# Patient Record
Sex: Male | Born: 1995 | Race: White | Hispanic: No | Marital: Single | Smoking: Never smoker
Health system: Southern US, Community
[De-identification: ages and names within clinical notes are randomized; demographics above are authoritative.]

## PROBLEM LIST (undated history)

## (undated) DIAGNOSIS — G40A19 Absence epileptic syndrome, intractable, without status epilepticus: Secondary | ICD-10-CM

## (undated) DIAGNOSIS — R011 Cardiac murmur, unspecified: Secondary | ICD-10-CM

## (undated) DIAGNOSIS — R112 Nausea with vomiting, unspecified: Secondary | ICD-10-CM

## (undated) DIAGNOSIS — F840 Autistic disorder: Secondary | ICD-10-CM

## (undated) DIAGNOSIS — R569 Unspecified convulsions: Secondary | ICD-10-CM

## (undated) DIAGNOSIS — Z9889 Other specified postprocedural states: Secondary | ICD-10-CM

## (undated) DIAGNOSIS — R4689 Other symptoms and signs involving appearance and behavior: Secondary | ICD-10-CM

## (undated) DIAGNOSIS — G9349 Other encephalopathy: Secondary | ICD-10-CM

## (undated) HISTORY — DX: Other symptoms and signs involving appearance and behavior: R46.89

## (undated) HISTORY — DX: Unspecified convulsions: R56.9

## (undated) HISTORY — DX: Other encephalopathy: G93.49

## (undated) HISTORY — PX: DENTAL SURGERY: SHX609

---
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
## 2016-01-04 ENCOUNTER — Telehealth: Payer: Self-pay

## 2016-01-04 NOTE — Telephone Encounter (Signed)
Con MemosAlex Young called stating that the patient's seizures have decreased but his behavior has definitely increased. He wanted to see about adjusting the patient's medication. He is requesting a call back.  CB:[phone]

## 2016-01-04 NOTE — Telephone Encounter (Addendum)
Seizures are in much better control.  I'm going to leave his dose as it is.  I think that he is having periods of agitation.  We will try 100 mg of vitamin B 6.  His caregiver does not want to give up seizure control for behavior.  He has been combative and engage in behaviors that were not present before starting levetiracetam.

## 2016-01-18 ENCOUNTER — Ambulatory Visit (INDEPENDENT_AMBULATORY_CARE_PROVIDER_SITE_OTHER): Payer: Medicaid Other | Admitting: Pediatrics

## 2016-01-18 ENCOUNTER — Encounter: Payer: Self-pay | Admitting: Pediatrics

## 2016-01-18 VITALS — Ht 73.0 in | Wt 187.0 lb

## 2016-01-18 DIAGNOSIS — F840 Autistic disorder: Secondary | ICD-10-CM

## 2016-01-18 DIAGNOSIS — G40209 Localization-related (focal) (partial) symptomatic epilepsy and epileptic syndromes with complex partial seizures, not intractable, without status epilepticus: Secondary | ICD-10-CM

## 2016-01-18 DIAGNOSIS — F71 Moderate intellectual disabilities: Secondary | ICD-10-CM | POA: Diagnosis not present

## 2016-01-18 MED ORDER — LEVETIRACETAM 500 MG PO TABS: ORAL_TABLET | ORAL | Status: DC

## 2016-01-18 NOTE — Progress Notes (Signed)
Patient: Nathan Moore MRN: [id number] Sex: male DOB: August 17, 1996  Provider: Deetta Perla, MD Location of Care: [facility]  Note type: Routine return visit  History of Present Illness: Referral Source: Porfirio Mylar Dohmeier History from: Home Caregiver, patient and [facility] chart Chief Complaint:Seizure  Nathan Moore is a 20 y.o. male who returns January 18, 2016, for the first time since October 20, 2015.  He has a history of seizures initially treated with an extended release carbamazepine and now with levetiracetam and Onfi.  Seizures are complex partial in nature, and are of variable duration.  The frequency has dropped considerably.  He has experienced eight in the last two and half months.  At the time I initially saw him, he was experiencing three to four seizures per week.  When he has seizures, he tends to rub his fingers for prolonged periods of time and stare unresponsively.  He has not had any recent convulsive events.  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  He is able to follow some simple commands, but he did not speak.  He wanted to lie down on the table and I had to continually pull him up.  He was not particularly cooperative.  The staff taking care of him raised concerns about behavior problems on levetiracetam started, but those have dissipated.  I have recommended vitamin B6, but they are not using it.  He is followed by a nurse practitioner who works with a psychiatrist, Alejandro Mulling.  I do not know, which group she works for.  His health has been good.  He is sleeping well.    Review of Systems: 12 system review was assessed except as noted above was otherwise negative  Past Medical History Diagnosis Date  . Absence seizures, intractable (HCC)   . Autism disorder   . Seizures (HCC)   . Aggressive behavior   . Chronic static encephalopathy    Hospitalizations: No., Head Injury: No., Nervous System  Infections: No., Immunizations up to date: Yes.    See office note from [facility] August 31, 2015 for recent history related to seizures; recent medications used to attempt to control seizures included carbamazepine, clonazepam, and Onfi; Some episodes appeared clearly to be complex partial in nature and prolonged; others are less clearcut behaviors where he seems to be unresponsive and then suddenly returns to his baseline  Birth History 8 lbs. 10 oz. infant born at [redacted] weeks gestational age to a 19 year old male. Gestation was complicated by gestational diabetes Mother received Epidural anesthesia  primary cesarean section Nursery Course was complicated by hypoglycemia, requirement for oxygen supplementation, elevated bilirubin Growth and Development was recalled as global delays, problems with behavior started at age 57, seizures at age 63-1/2  Behavior History poor social interaction and autism spectrum disorder; self-mutilatory  Surgical History History reviewed. No pertinent past surgical history.  Family History family history is not on file. Family history is negative for migraines, seizures, intellectual disabilities, blindness, deafness, birth defects, chromosomal disorder, or autism.  Social History . Marital Status: Single    Spouse Name: N/A  . Number of Children: N/A  . Years of Education: N/A   Social History Main Topics  . Smoking status: Never Smoker   . Smokeless tobacco: None  . Alcohol Use: No  . Drug Use: No  . Sexual Activity: No   Social History Narrative    Nathan Moore is non-verbal. Nathan Moore is a 10th grade student at [employer]; he does well in school. [facility] and has been there for 6-7 months. His parents live in [place] and they visit once a month. His caregiver reports him to be complaint at 8/10 of his medical visits and he states that his non-compliance does not include aggression, he will just sit in  the same spot and not move.    No Known Allergies  Physical Exam BP   Pulse   Ht 6\' 1"  (1.854 m)  Wt 187 lb (84.823 kg)  BMI 24.68 kg/m2  General: alert, well developed, well nourished, in no acute distress, brown hair, right handed Head: normocephalic, no dysmorphic features Respiratory: auscultation clear Cardiovascular: no murmurs, pulses are normal Musculoskeletal: no skeletal deformities or apparent scoliosis Skin: acneform facial rash, no neurocutaneous lesions  Neurologic Exam  Mental Status: alert; wary, I had the keep getting him to sit up; nonverbal; able to follow some simple commands Cranial Nerves: visual fields are full to double simultaneous stimuli; extraocular movements are full and conjugate; pupils are round reactive to light; funduscopic examination shows positive red reflex bilaterally; symmetric facial strength; midline tongue; turns to localize sound bilaterally Motor: normal functional strength, tone and mass; unable to test pronator drift Sensory: withdrawal 4 Coordination: unable to test; in general no signs of tremor Gait and Station: slightly broad-based and shuffling gait and station; balance is adequate; Romberg exam is negative Reflexes: symmetric and diminished bilaterally; no clonus; bilateral flexor plantar responses  Assessment 1. Partial epilepsy with impairment of consciousness, G40.209. 2. Autism spectrum disorder with accompanying language impairment requiring very substantial support (level 3), F84.0. 3. 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  Discussion I am pleased that Nathan SalinaBenjamin is responding to levetiracetam.  My intent is to increase levetiracetam to 1000 mg twice a day.  No change will be made in Onfi.  I do not prescribe any of his other medications.  Plan He will return to see me in three months' time.  I asked his caregiver to contact me concerning his response to medication both in terms of side effects and beneficial effects.  I  spent 30 minutes of face-to-face time with Nathan SalinaBenjamin and his caregiver, more than half of it in consultation.   Medication List   This list is accurate as of: 01/18/16 11:59 PM.       ABILIFY 10 MG tablet  Generic drug:  ARIPiprazole  Take 10 mg by mouth 2 (two) times daily.     cetirizine 10 MG tablet  Commonly known as:  ZYRTEC  Take 10 mg by mouth daily.     chlorproMAZINE 100 MG tablet  Commonly known as:  THORAZINE  Take 100 mg by mouth. Take 2 tablets in the morning and 1 tablet in the evening     chlorproMAZINE 50 MG tablet  Commonly known as:  THORAZINE  TAKE 1 TABLET BY MOUTH EVERY 4 HOURS AS NEEDED FOR AGITATION     clindamycin-benzoyl peroxide gel  Commonly known as:  BENZACLIN  Apply topically 2 (two) times daily. Apply to skin     levETIRAcetam 500 MG tablet  Commonly known as:  KEPPRA  Take 2 tablets twice daily     ONFI 10 MG tablet  Generic drug:  cloBAZam  Take 1-1/2 tablets twice daily     polyethylene glycol packet  Commonly known as:  MIRALAX / GLYCOLAX  Take 17 g by mouth at bedtime.     propranolol 80 MG tablet  Commonly known  as:  INDERAL  Take 80 mg by mouth 3 (three) times daily.      The medication list was reviewed and reconciled. All changes or newly prescribed medications were explained.  A complete medication list was provided to the patient/caregiver.  Deetta Perla MD

## 2016-02-02 DIAGNOSIS — Z0279 Encounter for issue of other medical certificate: Secondary | ICD-10-CM

## 2016-03-22 ENCOUNTER — Other Ambulatory Visit: Payer: Self-pay | Admitting: Pediatrics

## 2016-03-23 ENCOUNTER — Emergency Department (HOSPITAL_COMMUNITY)
Admission: EM | Admit: 2016-03-23 | Discharge: 2016-03-23 | Disposition: A | Discharge status: Home / Self Care | Payer: Medicaid Other | Attending: Emergency Medicine | Admitting: Emergency Medicine

## 2016-03-23 ENCOUNTER — Encounter (HOSPITAL_COMMUNITY): Payer: Self-pay | Admitting: *Deleted

## 2016-03-23 DIAGNOSIS — L551 Sunburn of second degree: Secondary | ICD-10-CM | POA: Insufficient documentation

## 2016-03-23 DIAGNOSIS — R220 Localized swelling, mass and lump, head: Secondary | ICD-10-CM | POA: Diagnosis present

## 2016-03-23 MED ORDER — IBUPROFEN 600 MG PO TABS: 600.0000 mg | ORAL_TABLET | Freq: Four times a day (QID) | ORAL | Status: DC | PRN

## 2016-03-23 MED ORDER — CALAMINE EX LOTN: 1.0000 "application " | TOPICAL_LOTION | CUTANEOUS | Status: DC | PRN

## 2016-03-23 MED ORDER — IBUPROFEN 400 MG PO TABS
600.0000 mg | ORAL_TABLET | Freq: Once | ORAL | Status: AC
  Administered 2016-03-23: 600 mg via ORAL
  Filled 2016-03-23: qty 1

## 2016-03-23 NOTE — Discharge Instructions (Signed)
Sunburn °Sunburn is damage to the skin caused by overexposure to ultraviolet (UV) rays. People with light skin or a fair complexion may be more susceptible to sunburn. Repeated sun exposure causes early skin aging such as wrinkles and sun spots. It also increases the risk of skin cancer. °CAUSES °A sunburn is caused by getting too much UV radiation from the sun. °SYMPTOMS °· Red or pink skin. °· Soreness and swelling. °· Pain. °· Blisters. °· Peeling skin. °· Headache, fever, and fatigue if sunburn covers a large area. °TREATMENT °· Your caregiver may tell you to take certain medicines to lessen inflammation. °· Your caregiver may have you use hydrocortisone cream or spray to help with itching and inflammation. °· Your caregiver may prescribe an antibiotic cream to use on blisters. °HOME CARE INSTRUCTIONS  °· Avoid further exposure to the sun. °· Cool baths and cool compresses may be helpful if used several times per day. Do not apply ice, since this may result in more damage to the skin. °· Only take over-the-counter or prescription medicines for pain, discomfort, or fever as directed by your caregiver. °· Use aloe or other over-the-counter sunburn creams or gels on your skin. Do not apply these creams or gels on blisters. °· Drink enough fluids to keep your urine clear or pale yellow. °· Do not break blisters. If blisters break, your caregiver may recommend an antibiotic cream to apply to the affected area. °PREVENTION  °· Try to avoid the sun between 10:00 a.m. and 4:00 p.m. when it is the strongest. °· Apply sunscreen at least 30 minutes before exposure to the sun. °· Always wear protective hats, clothing, and sunglasses with UV protection. °· Avoid medicines, herbs, and foods that increase your sensitivity to sunlight. °· Avoid tanning beds. °SEEK IMMEDIATE MEDICAL CARE IF:  °· You have a fever. °· Your pain is uncontrolled with medicine. °· You start to vomit or have diarrhea. °· You feel faint or develop a  headache with confusion. °· You develop severe blistering. °· You have a pus-like (purulent) discharge coming from the blisters.  °· Your burn becomes more painful and swollen. °MAKE SURE YOU: °· Understand these instructions. °· Will watch your condition. °· Will get help right away if you are not doing well or get worse. °  °This information is not intended to replace advice given to you by your health care provider. Make sure you discuss any questions you have with your health care provider. °  °Document Released: 07/11/2005 Document Revised: 01/26/2013 Document Reviewed: 04/04/2015 °Elsevier Interactive Patient Education ©2016 Elsevier Inc. ° °

## 2016-03-23 NOTE — ED Notes (Signed)
Con MemosAlex Moore, Caregiver,  states he understands instructions.Pt home stable with caregiver.

## 2016-03-23 NOTE — ED Provider Notes (Signed)
CSN: [id number]     Arrival date & time 03/23/16  4098 History   First MD Initiated Contact with Patient 03/23/16 [phone]     Chief Complaint  Patient presents with  . Seizures  . Facial Swelling     (Consider location/radiation/quality/duration/timing/severity/associated sxs/prior Treatment) HPI   20 year old male with history of MR brought in for evaluation of sunburn. Patient apparently has extremely sensitive skin. He also has a history of seizures. His caretaker felt that he may have had a seizure and laid specialists on for some time. He has some facial swelling, erythema small amount of blistering. He is nonverbal at baseline. His caretaker states that sometimes will cry out when he has pain, but he has not done this since his sunburn.  Past Medical History  Diagnosis Date  . Absence seizures, intractable (HCC)   . Autism disorder   . Seizures (HCC)   . Aggressive behavior   . Chronic static encephalopathy    History reviewed. No pertinent past surgical history. No family history on file. Social History  Substance Use Topics  . Smoking status: Never Smoker   . Smokeless tobacco: None  . Alcohol Use: No    Review of Systems  All systems reviewed and negative, other than as noted in HPI.   Allergies  Review of patient's allergies indicates no known allergies.  Home Medications   Prior to Admission medications   Medication Sig Start Date End Date Taking? Authorizing Provider  ABILIFY 10 MG tablet Take 10 mg by mouth 2 (two) times daily. 07/06/15   Historical Provider, MD  calamine lotion Apply 1 application topically as needed for itching. 03/23/16   Raeford Razor, MD  cetirizine (ZYRTEC) 10 MG tablet Take 10 mg by mouth daily. 07/06/15   Historical Provider, MD  chlorproMAZINE (THORAZINE) 100 MG tablet Take 100 mg by mouth. Take 2 tablets in the morning and 1 tablet in the evening    Historical Provider, MD  chlorproMAZINE (THORAZINE) 50 MG tablet TAKE 1 TABLET BY MOUTH  EVERY 4 HOURS AS NEEDED FOR AGITATION 10/03/15   Historical Provider, MD  clindamycin-benzoyl peroxide (BENZACLIN) gel Apply topically 2 (two) times daily. Apply to skin    Historical Provider, MD  ibuprofen (ADVIL,MOTRIN) 600 MG tablet Take 1 tablet (600 mg total) by mouth every 6 (six) hours as needed. 03/23/16   Raeford Razor, MD  levETIRAcetam (KEPPRA) 500 MG tablet Take 2 tablets twice daily 01/18/16   Deetta Perla, MD  ONFI 10 MG tablet TAKE ONE AND A HALF TABLETS BY MOUTH 2 TIMES DAILY 03/22/16   Elveria Rising, NP  polyethylene glycol (MIRALAX / GLYCOLAX) packet Take 17 g by mouth at bedtime.    Historical Provider, MD  propranolol (INDERAL) 80 MG tablet Take 80 mg by mouth 3 (three) times daily. 07/06/15   Historical Provider, MD   BP 115/89 mmHg  Pulse 94  Resp 16  SpO2 98% Physical Exam  Constitutional: He appears well-developed and well-nourished. No distress.  HENT:  Head: Normocephalic and atraumatic.  Diffuse erythema of the face and also to left hand and forearm. Bilateral periorbital swelling, left worse than right. Can't spontaneously open his eyes. No conjunctival injection. He does not appear to have photophobia with bright light shined into his eyes. There is a small amount of blistering.  Eyes: Conjunctivae are normal. Right eye exhibits no discharge. Left eye exhibits no discharge.  Neck: Neck supple.  Cardiovascular: Normal rate, regular rhythm and normal heart sounds.  Exam  reveals no gallop and no friction rub.   No murmur heard. Pulmonary/Chest: Effort normal and breath sounds normal. No respiratory distress.  Abdominal: Soft. He exhibits no distension. There is no tenderness.  Musculoskeletal: He exhibits no edema or tenderness.  Neurological: He is alert.  Skin: Skin is warm and dry.  Nursing note and vitals reviewed.   ED Course  Procedures (including critical care time) Labs Review Labs Reviewed - No data to display  Imaging Review No results  found. I have personally reviewed and evaluated these images and lab results as part of my medical decision-making.   EKG Interpretation None      MDM   Final diagnoses:  Sunburn, second degree    20 year old male with second-degree sunburn. Symptomatic treatment. Wound care as needed.    Raeford RazorStephen Jireh Elmore, MD 04/05/16 1352

## 2016-03-23 NOTE — ED Notes (Signed)
Per EMS- pt had a seizure yesterday and was laying outside. Pt has sun sensitivity and is noted to have burn to left side of face and arm. Pt woke this morning with swelling to left side of face as well. Pt ambulatory but nonverbal at baseline

## 2016-04-24 ENCOUNTER — Encounter (HOSPITAL_COMMUNITY): Payer: Self-pay

## 2016-04-24 ENCOUNTER — Emergency Department (HOSPITAL_COMMUNITY)
Admission: EM | Admit: 2016-04-24 | Discharge: 2016-04-24 | Disposition: A | Discharge status: Home / Self Care | Payer: Medicaid Other | Attending: Emergency Medicine | Admitting: Emergency Medicine

## 2016-04-24 DIAGNOSIS — F79 Unspecified intellectual disabilities: Secondary | ICD-10-CM | POA: Insufficient documentation

## 2016-04-24 DIAGNOSIS — R569 Unspecified convulsions: Secondary | ICD-10-CM

## 2016-04-24 DIAGNOSIS — G40909 Epilepsy, unspecified, not intractable, without status epilepticus: Secondary | ICD-10-CM | POA: Diagnosis not present

## 2016-04-24 DIAGNOSIS — Z79899 Other long term (current) drug therapy: Secondary | ICD-10-CM | POA: Diagnosis not present

## 2016-04-24 LAB — BASIC METABOLIC PANEL
Anion gap: 6 (ref 5–15)
BUN: 12 mg/dL (ref 6–20)
CALCIUM: 9.1 mg/dL (ref 8.9–10.3)
CHLORIDE: 109 mmol/L (ref 101–111)
CO2: 26 mmol/L (ref 22–32)
CREATININE: 0.82 mg/dL (ref 0.61–1.24)
GFR calc Af Amer: >60 mL/min (ref >60)
GFR calc non Af Amer: >60 mL/min (ref >60)
GLUCOSE: 108 mg/dL — AB (ref 65–99)
Potassium: 3.9 mmol/L (ref 3.5–5.1)
Sodium: 141 mmol/L (ref 135–145)

## 2016-04-24 LAB — CBC
HCT: 45.4 % (ref 39.0–52.0)
Hemoglobin: 15.8 g/dL (ref 13.0–17.0)
MCH: 29.3 pg (ref 26.0–34.0)
MCHC: 34.8 g/dL (ref 30.0–36.0)
MCV: 84.1 fL (ref 78.0–100.0)
PLATELETS: 127 10*3/uL — AB (ref 150–400)
RBC: 5.4 MIL/uL (ref 4.22–5.81)
RDW: 12.5 % (ref 11.5–15.5)
WBC: 8.9 10*3/uL (ref 4.0–10.5)

## 2016-04-24 LAB — URINALYSIS, ROUTINE W REFLEX MICROSCOPIC
BILIRUBIN URINE: NEGATIVE
GLUCOSE, UA: NEGATIVE mg/dL
HGB URINE DIPSTICK: NEGATIVE
KETONES UR: NEGATIVE mg/dL
Leukocytes, UA: NEGATIVE
Nitrite: NEGATIVE
PROTEIN: NEGATIVE mg/dL
Specific Gravity, Urine: 1.021 (ref 1.005–1.030)
pH: 6.5 (ref 5.0–8.0)

## 2016-04-24 LAB — CBG MONITORING, ED: Glucose-Capillary: 98 mg/dL (ref 65–99)

## 2016-04-24 MED ORDER — SODIUM CHLORIDE 0.9 % IV SOLN
1000.0000 mg | Freq: Once | INTRAVENOUS | Status: AC
  Administered 2016-04-24: 1000 mg via INTRAVENOUS
  Filled 2016-04-24: qty 10

## 2016-04-24 NOTE — ED Notes (Signed)
Bed: GN56WA05 Expected date:  Expected time:  Means of arrival:  Comments: EMS- seizure, hyperglycemia

## 2016-04-24 NOTE — ED Notes (Addendum)
PT RECEIVED VIA EMS FOR A WITNESSED SEIZURE WHILE EATING AT [employer] WITH HIS CAREGIVER. PER THE CAREGIVER, THE SEIZURE LASTED APPROX 3 MINUTES AND HE WAS POSTICTAL UPON THEIR ARRIVAL. INITIAL CBG WAS "HIGH" PER EMS. PT HAS A HISTORY OF AUTISM, EPILEPSY, AND IS NONVERBAL, NO HX OF DM. NO HEAD INJURY.

## 2016-04-24 NOTE — ED Notes (Signed)
Pt ambulatory and independent at discharge.  Left with group home staff.

## 2016-04-24 NOTE — ED Notes (Addendum)
[name] YOUNG SUPERVISOR FROM THE GROUP HOME AT THE BEDSIDE.

## 2016-04-24 NOTE — Discharge Instructions (Signed)
Continue home keppra. Recommend to follow-up with your primary care doctor May also wish to follow-up with your neurologist. Return here for new concerns.

## 2016-04-24 NOTE — ED Notes (Signed)
PT HAD A WITNESSED FOCAL SEIZURE. SEIZURE PADS IN PLACE FOR SAFETY.

## 2016-04-24 NOTE — ED Provider Notes (Signed)
CSN: [id number]     Arrival date & time 04/24/16  1306 History   First MD Initiated Contact with Patient 04/24/16 1330     Chief Complaint  Patient presents with  . Hyperglycemia    HIGH READING  . Seizures    HX OF EPILEPSY     (Consider location/radiation/quality/duration/timing/severity/associated sxs/prior Treatment) Patient is a 20 y.o. male presenting with hyperglycemia and seizures. The history is provided by the patient and medical records.  Hyperglycemia Seizures   LEVEL V CAVEAT:  PATIENT NONVERBAL History provided by EMS, care giver, and EMR.  20 year old male with history of absence seizures, autism, mental retardation, chronic encephalopathy, presenting to the ED after a seizure. Patient was apparently eating while at [facility] and had a seizure.  Caregiver estimates this lasted approximately 3 minutes, typical of his absence seizures.  No fall or head injury.  Patient was postictal upon EMS arrival, has remained oriented to his baseline here. His vital signs are stable.  EMS reports CBG was reading "high" however CBG on arrival here is 98.   Past Medical History  Diagnosis Date  . Absence seizures, intractable (HCC)   . Autism disorder   . Seizures (HCC)   . Aggressive behavior   . Chronic static encephalopathy    History reviewed. No pertinent past surgical history. History reviewed. No pertinent family history. Social History  Substance Use Topics  . Smoking status: Never Smoker   . Smokeless tobacco: None  . Alcohol Use: No    Review of Systems  Unable to perform ROS: Patient nonverbal      Allergies  Review of patient's allergies indicates no known allergies.  Home Medications   Prior to Admission medications   Medication Sig Start Date End Date Taking? Authorizing Provider  ABILIFY 10 MG tablet Take 10 mg by mouth 2 (two) times daily. 07/06/15   Historical Provider, MD  calamine lotion Apply 1 application topically as needed for itching. 03/23/16    Raeford Razor, MD  cetirizine (ZYRTEC) 10 MG tablet Take 10 mg by mouth daily. 07/06/15   Historical Provider, MD  chlorproMAZINE (THORAZINE) 100 MG tablet Take 100 mg by mouth. Take 2 tablets in the morning and 1 tablet in the evening    Historical Provider, MD  chlorproMAZINE (THORAZINE) 50 MG tablet TAKE 1 TABLET BY MOUTH EVERY 4 HOURS AS NEEDED FOR AGITATION 10/03/15   Historical Provider, MD  clindamycin-benzoyl peroxide (BENZACLIN) gel Apply topically 2 (two) times daily. Apply to skin    Historical Provider, MD  ibuprofen (ADVIL,MOTRIN) 600 MG tablet Take 1 tablet (600 mg total) by mouth every 6 (six) hours as needed. 03/23/16   Raeford Razor, MD  levETIRAcetam (KEPPRA) 500 MG tablet Take 2 tablets twice daily 01/18/16   Deetta Perla, MD  ONFI 10 MG tablet TAKE ONE AND A HALF TABLETS BY MOUTH 2 TIMES DAILY 03/22/16   Elveria Rising, NP  polyethylene glycol (MIRALAX / GLYCOLAX) packet Take 17 g by mouth at bedtime.    Historical Provider, MD  propranolol (INDERAL) 80 MG tablet Take 80 mg by mouth 3 (three) times daily. 07/06/15   Historical Provider, MD   BP 121/76 mmHg  Pulse 70  Temp(Src) 97.8 F (36.6 C) (Axillary)  Resp 20  Ht  (1.803 m)  Wt 86.183 kg  BMI 26.51 kg/m2  SpO2 98%   Physical Exam  Constitutional: He appears well-developed and well-nourished.  Awake, alert, no distress  HENT:  Head: Normocephalic and atraumatic.  Mouth/Throat:  Oropharynx is clear and moist.  Eyes: Conjunctivae and EOM are normal. Pupils are equal, round, and reactive to light.  Neck: Normal range of motion.  Cardiovascular: Normal rate, regular rhythm and normal heart sounds.   Pulmonary/Chest: Effort normal and breath sounds normal.  Abdominal: Soft. Bowel sounds are normal.  Musculoskeletal: Normal range of motion.  Neurological: He is alert. He displays no tremor. He displays no seizure activity.  Awake, alert, spontaneously moving all extremities  Skin: Skin is warm and dry.   Psychiatric: He has a normal mood and affect. He is noncommunicative.  Nonverbal at baseline  Nursing note and vitals reviewed.   ED Course  Procedures (including critical care time) Labs Review Labs Reviewed  BASIC METABOLIC PANEL - Abnormal; Notable for the following:    Glucose, Bld 108 (*)    All other components within normal limits  CBC - Abnormal; Notable for the following:    Platelets 127 (*)    All other components within normal limits  URINALYSIS, ROUTINE W REFLEX MICROSCOPIC (NOT AT [facility])  CBG MONITORING, ED  CBG MONITORING, ED    Imaging Review No results found. I have personally reviewed and evaluated these images and lab results as part of my medical decision-making.   EKG Interpretation None      MDM   Final diagnoses:  Seizure [facility](HCC)   20 year old male [facility]. Has hx of same. Postictal upon EMS arrival, but oriented to his baseline here.  His vital signs are stable. He is spontaneously moving all of his extremities. He is nonverbal at baseline. His labs here are reassuring. Unsure why CBG with EMS was reading as "high".  UA without any signs of infection. Patient was loaded with 1 g IV Keppra. Will discharge home to follow-up with his neurologist.  Discussed plan with care giver, they acknowledged understanding and agreed with plan of care.  Return precautions given for new or worsening symptoms.  Garlon HatchetLisa M Keno Caraway, PA-C 04/24/16 1617  Nelva Nayobert Beaton, MD 04/25/16 [phone]

## 2016-05-02 ENCOUNTER — Encounter (HOSPITAL_COMMUNITY): Payer: Self-pay | Admitting: Emergency Medicine

## 2016-05-02 ENCOUNTER — Emergency Department (HOSPITAL_COMMUNITY): Payer: Medicaid Other

## 2016-05-02 ENCOUNTER — Emergency Department (HOSPITAL_COMMUNITY)
Admission: EM | Admit: 2016-05-02 | Discharge: 2016-05-03 | Disposition: A | Discharge status: Home / Self Care | Payer: Medicaid Other | Attending: Emergency Medicine | Admitting: Emergency Medicine

## 2016-05-02 DIAGNOSIS — R569 Unspecified convulsions: Secondary | ICD-10-CM | POA: Diagnosis present

## 2016-05-02 DIAGNOSIS — Z79899 Other long term (current) drug therapy: Secondary | ICD-10-CM | POA: Diagnosis not present

## 2016-05-02 DIAGNOSIS — G40909 Epilepsy, unspecified, not intractable, without status epilepticus: Secondary | ICD-10-CM | POA: Diagnosis not present

## 2016-05-02 LAB — COMPREHENSIVE METABOLIC PANEL
ALBUMIN: 4.5 g/dL (ref 3.5–5.0)
ALT: 21 U/L (ref 17–63)
AST: 20 U/L (ref 15–41)
Alkaline Phosphatase: 76 U/L (ref 38–126)
Anion gap: 7 (ref 5–15)
BUN: 9 mg/dL (ref 6–20)
CHLORIDE: 106 mmol/L (ref 101–111)
CO2: 25 mmol/L (ref 22–32)
CREATININE: 0.81 mg/dL (ref 0.61–1.24)
Calcium: 9 mg/dL (ref 8.9–10.3)
GFR calc Af Amer: >60 mL/min (ref >60)
GLUCOSE: 99 mg/dL (ref 65–99)
Potassium: 3.5 mmol/L (ref 3.5–5.1)
Sodium: 138 mmol/L (ref 135–145)
Total Bilirubin: 1.2 mg/dL (ref 0.3–1.2)
Total Protein: 7.1 g/dL (ref 6.5–8.1)

## 2016-05-02 LAB — CBC WITH DIFFERENTIAL/PLATELET
Basophils Absolute: 0 10*3/uL (ref 0.0–0.1)
Basophils Relative: 0 %
EOS ABS: 0.2 10*3/uL (ref 0.0–0.7)
EOS PCT: 2 %
HCT: 42.7 % (ref 39.0–52.0)
Hemoglobin: 14.9 g/dL (ref 13.0–17.0)
LYMPHS ABS: 2.4 10*3/uL (ref 0.7–4.0)
LYMPHS PCT: 22 %
MCH: 28.9 pg (ref 26.0–34.0)
MCHC: 34.9 g/dL (ref 30.0–36.0)
MCV: 82.9 fL (ref 78.0–100.0)
MONO ABS: 0.8 10*3/uL (ref 0.1–1.0)
MONOS PCT: 7 %
Neutro Abs: 7.4 10*3/uL (ref 1.7–7.7)
Neutrophils Relative %: 69 %
PLATELETS: 154 10*3/uL (ref 150–400)
RBC: 5.15 MIL/uL (ref 4.22–5.81)
RDW: 11.9 % (ref 11.5–15.5)
WBC: 10.7 10*3/uL — AB (ref 4.0–10.5)

## 2016-05-02 LAB — CBG MONITORING, ED: GLUCOSE-CAPILLARY: 118 mg/dL — AB (ref 65–99)

## 2016-05-02 MED ORDER — LORAZEPAM 2 MG/ML IJ SOLN
1.0000 mg | Freq: Once | INTRAMUSCULAR | Status: AC
  Administered 2016-05-02: 1 mg via INTRAVENOUS
  Filled 2016-05-02: qty 1

## 2016-05-02 NOTE — ED Notes (Signed)
Bed: [id number] Expected date:  Expected time:  Means of arrival:  Comments: EMS seizure/MR/non-verbal

## 2016-05-02 NOTE — ED Notes (Addendum)
Pt BIB [facility] after having multiple grand mal seizures today. Hx of same. Unsure how often these happen but he was seen here for one  last week. Laceration to tongue and bottom lip on R side. Alert. Nonverbal autism at baseline.

## 2016-05-02 NOTE — ED Notes (Signed)
Patient transported to CT 

## 2016-05-02 NOTE — ED Provider Notes (Signed)
CSN: [id number]     Arrival date & time 05/02/16  2004 History   First MD Initiated Contact with Patient 05/02/16 2055     Chief Complaint  Patient presents with  . Seizures    HPI Pt has history of autism and seizures.  He had an episode today where he started  Shaking in both arms.  He was yelling.  That lasted for 45 minutes with approximately20 minutes of shaking.  It resolved for about 10 to15 minutes and had another episode lasting another hour.  Since then he has been sleeping.  He has been in his current group home for about a year.  He tends to have these seizures once per week.  At baseline he is non verbal but does understand speech. Past Medical History  Diagnosis Date  . Absence seizures, intractable (HCC)   . Autism disorder   . Seizures (HCC)   . Aggressive behavior   . Chronic static encephalopathy    History reviewed. No pertinent past surgical history. History reviewed. No pertinent family history. Social History  Substance Use Topics  . Smoking status: Never Smoker   . Smokeless tobacco: None  . Alcohol Use: No    Review of Systems  All other systems reviewed and are negative.     Allergies  Review of patient's allergies indicates no known allergies.  Home Medications   Prior to Admission medications   Medication Sig Start Date End Date Taking? Authorizing Provider  ABILIFY 10 MG tablet Take 10 mg by mouth 2 (two) times daily. 07/06/15  Yes Historical Provider, MD  cetirizine (ZYRTEC) 10 MG tablet Take 10 mg by mouth daily.   Yes Historical Provider, MD  chlorproMAZINE (THORAZINE) 100 MG tablet Take 100-200 mg by mouth 2 (two) times daily. Take 2 tablets in the morning and 1 tablet in the evening   Yes Historical Provider, MD  chlorproMAZINE (THORAZINE) 50 MG tablet TAKE 1 TABLET BY MOUTH EVERY 4 HOURS AS NEEDED FOR AGITATION 10/03/15  Yes Historical Provider, MD  levETIRAcetam (KEPPRA) 500 MG tablet Take 2 tablets twice daily Patient taking differently:  Take 1,000 mg by mouth 2 (two) times daily.  01/18/16  Yes Deetta PerlaWilliam H Hickling, MD  ONFI 10 MG tablet TAKE ONE AND A HALF TABLETS BY MOUTH 2 TIMES DAILY Patient taking differently: TAKE ONE AND A HALF TABLETS (15mg ) BY MOUTH 2 TIMES DAILY 03/22/16  Yes Elveria Risingina Goodpasture, NP  propranolol (INDERAL) 80 MG tablet Take 80 mg by mouth 3 (three) times daily. 07/06/15  Yes Historical Provider, MD   BP 145/123 mmHg  Pulse 70  Temp(Src) 98 F (36.7 C) (Axillary)  Resp 18  SpO2 96% Physical Exam  Constitutional: No distress.  HENT:  Head: Normocephalic.  Right Ear: External ear normal.  Left Ear: External ear normal.  Abrasion right forehead  Eyes: Conjunctivae are normal. Right eye exhibits no discharge. Left eye exhibits no discharge. No scleral icterus.  Neck: Neck supple. No tracheal deviation present.  Cardiovascular: Normal rate, regular rhythm and intact distal pulses.   Pulmonary/Chest: Effort normal and breath sounds normal. No stridor. No respiratory distress. He has no wheezes. He has no rales.  Abdominal: Soft. Bowel sounds are normal. He exhibits no distension. There is no tenderness. There is no rebound and no guarding.  Musculoskeletal: He exhibits no edema or tenderness.  Neurological: He is alert. No cranial nerve deficit (no facial droop,  ) or sensory deficit. He exhibits normal muscle tone. He displays no seizure  activity. Coordination normal.  Pt moves both arms and legs when asked  Skin: Skin is warm and dry. No rash noted. He is not diaphoretic.  Psychiatric: He has a normal mood and affect.  Nursing note and vitals reviewed.   ED Course  Procedures (including critical care time) Labs Review Labs Reviewed  CBC WITH DIFFERENTIAL/PLATELET - Abnormal; Notable for the following:    WBC 10.7 (*)    All other components within normal limits  CBG MONITORING, ED - Abnormal; Notable for the following:    Glucose-Capillary 118 (*)    All other components within normal limits   COMPREHENSIVE METABOLIC PANEL    Imaging Review Ct Head Wo Contrast  05/02/2016  CLINICAL DATA:  Seizure today.  Patient with seizure history. EXAM: CT HEAD WITHOUT CONTRAST TECHNIQUE: Contiguous axial images were obtained from the base of the skull through the vertex without intravenous contrast. COMPARISON:  None. FINDINGS: Brain: No intracranial hemorrhage, mass effect, or midline shift. No hydrocephalus. The basilar cisterns are patent. No evidence of territorial infarct. No intracranial fluid collection. Low lying cerebellar tonsils, right greater than left. Vascular: No hyperdense vessel or abnormal calcification. Skull: Negative for fracture or focal lesion. Calvarium is intact. Sinuses/Orbits: No acute findings.Included paranasal sinuses and mastoid air cells are well aerated. Other: None. IMPRESSION: No acute intracranial abnormality. Low lying cerebellar tonsils. Electronically Signed   By: Rubye Oaks M.D.   On: 05/02/2016 23:25   I have personally reviewed and evaluated these images and lab results as part of my medical decision-making.  Medications given in the ED Medications  LORazepam (ATIVAN) injection 1 mg (1 mg Intravenous Given 05/02/16 2030)      MDM   Final diagnoses:  Seizure disorder (HCC)    Pt was monitored in the ED for several hours.  No further seizure activity.  Caregiver states that patient typically has a seizure a week.  It used to be more frequent until he was started on Keppra.  No sign of acute infection.  CT scan without acute injury.  Will dc home.  Follow up with his neurologist.    Linwood Dibbles, MD 05/03/16 [phone]

## 2016-05-02 NOTE — ED Notes (Signed)
Group home member at bedside.

## 2016-05-03 NOTE — Discharge Instructions (Signed)

## 2016-05-03 NOTE — ED Notes (Signed)
Pt escorted out by group home staff.  Group home staff verbalized understanding of discharge instructions.

## 2016-05-10 ENCOUNTER — Telehealth: Payer: Self-pay

## 2016-05-10 NOTE — Telephone Encounter (Addendum)
I think if this happens again that he should be admitted to the hospital for status epilepticus.  I know that it won't be easy, but should have a workup through the neuro hospitalists.  My partners can also see him.

## 2016-05-10 NOTE — Telephone Encounter (Signed)
Spoke with Nathan Moore this morning and he informed me that Nathan Moore has been in the hospital twice over the last two weeks. He states that he has been having seizures. He states that they last for over three hours. He also states that when he comes out of the seizure, it is for two minutes and then he goes back into another seizure.   I informed Nathan Moore that Dr. Rexene Edison will be out of town until the 8th of August. We have scheduled him for August 10th @ 9:45.  CB:[phone]

## 2016-05-17 ENCOUNTER — Other Ambulatory Visit: Payer: Self-pay | Admitting: Pediatrics

## 2016-05-17 ENCOUNTER — Other Ambulatory Visit: Payer: Self-pay | Admitting: Family

## 2016-05-17 DIAGNOSIS — G40209 Localization-related (focal) (partial) symptomatic epilepsy and epileptic syndromes with complex partial seizures, not intractable, without status epilepticus: Secondary | ICD-10-CM

## 2016-05-24 ENCOUNTER — Encounter: Payer: Self-pay | Admitting: Pediatrics

## 2016-05-24 ENCOUNTER — Ambulatory Visit (INDEPENDENT_AMBULATORY_CARE_PROVIDER_SITE_OTHER): Payer: Medicaid Other | Admitting: Pediatrics

## 2016-05-24 VITALS — BP 108/70 | HR 76 | Ht 73.0 in | Wt 179.8 lb

## 2016-05-24 DIAGNOSIS — G40209 Localization-related (focal) (partial) symptomatic epilepsy and epileptic syndromes with complex partial seizures, not intractable, without status epilepticus: Secondary | ICD-10-CM | POA: Diagnosis not present

## 2016-05-24 DIAGNOSIS — R259 Unspecified abnormal involuntary movements: Secondary | ICD-10-CM | POA: Diagnosis not present

## 2016-05-24 DIAGNOSIS — F840 Autistic disorder: Secondary | ICD-10-CM | POA: Diagnosis not present

## 2016-05-24 NOTE — Progress Notes (Signed)
Patient: Nathan Moore MRN: [id number] Sex: male DOB: May 08, 1996  Provider: Deetta Perla, MD Location of Care: [facility]  Note type: Routine return visit  History of Present Illness: Referral Source: Porfirio Mylar Dohmeier History from: Caregiver, patient and [facility] chart Chief Complaint: Seizure  Nathan Moore is a 20 y.o. male who returns on May 24, 2016 for the first time since January 18, 2016.  He has a history of complex partial seizures with impairment of consciousness, autism spectrum disorder with accompanying language impairment requiring very substantial support, and moderate intellectual disability.  He lives in a group home.  When he presented, he had experienced eight seizures in two and half months before I saw him.  I started him on Onfi, which I added to levetiracetam and his seizure frequency dropped from three to four seizures per week to none.  At the time it was noted that he tends to rub his fingers for prolonged periods of time and stare unresponsively.  It was not clear then and is not now whether this activity represent seizures or some cell stimulatory behavior.  I elected to add Onfi to levetiracetam to treat his seizures.  This initially worked.  However, beginning on April 17, 2016 extending through the present he has experienced behaviors that are interpreted by his caregivers of seizures.  He will sit on the floor and start rotating.  He has cell stimulatory behavior rubbing his fingers together until they bleed.  He does not make eye contact, but it is not clear that he is having unresponsive staring.  He accompanies this sometimes by loud screen.  The episodes can last for minutes, but have persisted for as long as four hours.  They do not change character during this time they merely speed up slowed down.  He has not bitten his tongue.  He has not experienced vomiting.  He will lie down and go to sleep.  He occasionally has clusters of seizures.   Right now they are occurring about three times a week.  On two occasions, May 04, 2016 and May 12, 2016 he was taken to the hospital when the duration of his seizures was greater than 20 minutes.  For reasons that are unclear to me he was not admitted to the hospital.  He comes today because it is necessary to assess him and to determine whether the episodes that have been witnessed truly represent seizure activity or are some cell stimulatory behavior.  Unfortunately, we are not going to be able to determine if he is having seizures when he has this behavior without a video that presents images of his face and his body.  I do not think it is going to be possible to do an EEG at the onset of his seizures.  I do not think he will keep the leads on.  He is having some problems falling asleep.  He has received his medication as prescribed.  Review of Systems: 12 system review was assessed and it was negative  Past Medical History Diagnosis Date  . Absence seizures, intractable (HCC)   . Aggressive behavior   . Autism disorder   . Chronic static encephalopathy   . Seizures (HCC)    Hospitalizations: No., Head Injury: No., Nervous System Infections: No., Immunizations up to date: Yes.    See office note from [facility] August 31, 2015 for recent history related to seizures; recent medications used to attempt to control seizures included carbamazepine, clonazepam, and Onfi; Some  episodes appeared clearly to be complex partial in nature and prolonged; others are less clearcut behaviors where he seems to be unresponsive and then suddenly returns to his baseline  Birth History 8 lbs. 10 oz. infant born at 2638 weeks gestational age to a 19 year old male. Gestation was complicated by gestational diabetes Mother received Epidural anesthesia  primary cesarean section Nursery Course was complicated by hypoglycemia, requirement for oxygen supplementation, elevated  bilirubin Growth and Development was recalled as global delays, problems with behavior started at age 20, seizures at age 55-1/2  Behavior History autism spectrum disorder (level III)  Surgical History No past surgical history on file.  Family History family history is not on file. Family history is negative for migraines, seizures, intellectual disabilities, blindness, deafness, birth defects, chromosomal disorder, or autism.  Social History . Marital status: Single    Spouse name: N/A  . Number of children: N/A  . Years of education: N/A   Social History Main Topics  . Smoking status: Never Smoker  . Smokeless tobacco: Never Used  . Alcohol use No  . Drug use: No  . Sexual activity: No   Social History Narrative    Nathan Moore is non-verbal. 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     [facility] and has been there for 6-7 months.     His parents live in [place] and they visit once a month.     His caregiver reports him to be complaint at 8/10 of his medical visits and he states that his non-compliance does not include aggression, he will just sit in the same spot and not move.    No Known Allergies  Physical Exam BP 108/70   Pulse 76   Ht 6\' 1"  (1.854 m)   Wt 179 lb 12.8 oz (81.6 kg)   BMI 23.72 kg/m   General: alert, well developed, well nourished, in no acute distress, brown hair, brown eyes, right handed Head: normocephalic, no dysmorphic features Respiratory: auscultation clear Cardiovascular: no murmurs, pulses are normal Musculoskeletal: no skeletal deformities or apparent scoliosis Skin: acneform facial rash, no neurocutaneous lesions  Neurologic Exam  Mental Status: alert; wary, I had to keep getting him to sit up; nonverbal; able to follow some simple commands Cranial Nerves: visual fields are full to double simultaneous stimuli; extraocular movements are full and conjugate; pupils are round reactive to light;  funduscopic examination shows positive red reflex bilaterally; symmetric facial strength; midline tongue; turns to localize sound bilaterally Motor: normal functional strength, tone and mass; unable to test pronator drift Sensory: withdrawal 4 Coordination: unable to test; in general no signs of tremor Gait and Station: slightly broad-based and shuffling gait and station; balance is adequate; Romberg exam is negative Reflexes: symmetric and diminished bilaterally; no clonus; bilateral flexor plantar responses  Assessment 1. Partial epilepsy with impairment of consciousness, G40.209. 2. Autism spectrum disorder with accompanying language impairment requiring very substantial support (level 3), F84.0. 3. Abnormal involuntary movements, R25.9.  Discussion We need to determine that these episodes are epileptic or non-epileptic in nature.    Plan I asked the caregiver to make a video if the behavior focusing on his face and on his body so we could determine the behaviors seemed to be consistent with seizures.  If we determine that he is having seizures, I would probably not push Onfi further, but would likely chose some other medication such as Fycompa.  If they are non-epileptic,  then we should not increase his antiepileptic dose and case these events.  I spent 30 minutes of face-to-face time with [name].  I made no changes in his medication.  He will return to see me in three months' time.  I may need to see him sooner based on clinical need.   Medication List   Accurate as of 05/24/16  9:52 AM.      ABILIFY 10 MG tablet Generic drug:  ARIPiprazole Take 10 mg by mouth 2 (two) times daily.   cetirizine 10 MG tablet Commonly known as:  ZYRTEC Take 10 mg by mouth daily.   chlorproMAZINE 100 MG tablet Commonly known as:  THORAZINE Take 100-200 mg by mouth 2 (two) times daily. Take 2 tablets in the morning and 1 tablet in the evening   chlorproMAZINE 50 MG tablet Commonly known as:   THORAZINE TAKE 1 TABLET BY MOUTH EVERY 4 HOURS AS NEEDED FOR AGITATION   levETIRAcetam 500 MG tablet Commonly known as:  KEPPRA Take 2 tablets twice daily   ONFI 10 MG tablet Generic drug:  cloBAZam TAKE ONE AND A HALF TABLETS BY MOUTH 2 TIMES DAILY   propranolol 80 MG tablet Commonly known as:  INDERAL Take 80 mg by mouth 3 (three) times daily.     The medication list was reviewed and reconciled. All changes or newly prescribed medications were explained.  A complete medication list was provided to the patient/caregiver.  Deetta Perla MD

## 2016-05-24 NOTE — Patient Instructions (Addendum)
We need to determine if these movements are epileptic or nonepileptic. Please make a video of the behavior looking as body.  If the episodes go 5-10 minutes, call EMS so that we can have professional caregivers evaluate him and determine whether or not he needs to go to the emergency department to treat seizures or agitated behavior.  I recommend no change in his medications for now until we determine whether these movements are epileptic or nonepileptic.

## 2016-06-05 ENCOUNTER — Other Ambulatory Visit: Payer: Self-pay | Admitting: Family

## 2016-06-11 ENCOUNTER — Encounter (HOSPITAL_COMMUNITY): Payer: Self-pay

## 2016-06-11 ENCOUNTER — Emergency Department (HOSPITAL_COMMUNITY)
Admission: EM | Admit: 2016-06-11 | Discharge: 2016-06-11 | Disposition: A | Discharge status: Home / Self Care | Payer: Medicaid Other | Attending: Emergency Medicine | Admitting: Emergency Medicine

## 2016-06-11 DIAGNOSIS — F840 Autistic disorder: Secondary | ICD-10-CM | POA: Diagnosis not present

## 2016-06-11 DIAGNOSIS — G40909 Epilepsy, unspecified, not intractable, without status epilepticus: Secondary | ICD-10-CM | POA: Diagnosis not present

## 2016-06-11 DIAGNOSIS — R569 Unspecified convulsions: Secondary | ICD-10-CM

## 2016-06-11 LAB — CBC WITH DIFFERENTIAL/PLATELET
Basophils Absolute: 0 10*3/uL (ref 0.0–0.1)
Basophils Relative: 0 %
EOS ABS: 0.2 10*3/uL (ref 0.0–0.7)
Eosinophils Relative: 2 %
HEMATOCRIT: 42.8 % (ref 39.0–52.0)
HEMOGLOBIN: 14.8 g/dL (ref 13.0–17.0)
LYMPHS ABS: 1.7 10*3/uL (ref 0.7–4.0)
LYMPHS PCT: 16 %
MCH: 29.1 pg (ref 26.0–34.0)
MCHC: 34.6 g/dL (ref 30.0–36.0)
MCV: 84.3 fL (ref 78.0–100.0)
MONOS PCT: 7 %
Monocytes Absolute: 0.8 10*3/uL (ref 0.1–1.0)
NEUTROS ABS: 7.8 10*3/uL — AB (ref 1.7–7.7)
Neutrophils Relative %: 75 %
Platelets: 145 10*3/uL — ABNORMAL LOW (ref 150–400)
RBC: 5.08 MIL/uL (ref 4.22–5.81)
RDW: 11.8 % (ref 11.5–15.5)
WBC: 10.4 10*3/uL (ref 4.0–10.5)

## 2016-06-11 LAB — BASIC METABOLIC PANEL
ANION GAP: 11 (ref 5–15)
BUN: 10 mg/dL (ref 6–20)
CHLORIDE: 103 mmol/L (ref 101–111)
CO2: 24 mmol/L (ref 22–32)
Calcium: 9.3 mg/dL (ref 8.9–10.3)
Creatinine, Ser: 0.9 mg/dL (ref 0.61–1.24)
GFR calc Af Amer: >60 mL/min (ref >60)
GFR calc non Af Amer: >60 mL/min (ref >60)
GLUCOSE: 106 mg/dL — AB (ref 65–99)
POTASSIUM: 3.6 mmol/L (ref 3.5–5.1)
Sodium: 138 mmol/L (ref 135–145)

## 2016-06-11 MED ORDER — SODIUM CHLORIDE 0.9 % IV SOLN
1000.0000 mg | Freq: Once | INTRAVENOUS | Status: AC
  Administered 2016-06-11: 1000 mg via INTRAVENOUS
  Filled 2016-06-11: qty 10

## 2016-06-11 MED ORDER — CHLORPROMAZINE HCL 25 MG PO TABS: 50.0000 mg | ORAL_TABLET | Freq: Once | ORAL | Status: DC

## 2016-06-11 NOTE — ED Notes (Signed)
Patient went back to Group home with caregiver form the group home.  Caregiver took papers and report about patient.  Patient belongings taken by caregiver.  Patient stable for discharge

## 2016-06-11 NOTE — ED Provider Notes (Signed)
[facility]-EMERGENCY DEPT Provider Note   CSN: [id number] Arrival date & time: 06/11/16  1738     History   Chief Complaint Chief Complaint  Patient presents with  . Seizures    HPI Nathan Moore is a 20 y.o. male.   Seizures   This is a recurrent problem. The current episode started 1 to 2 hours ago. The problem has not changed since onset.There were 2 to 3 seizures. The most recent episode lasted 30 to 120 seconds. Pertinent negatives include no sleepiness, no neck stiffness and no chest pain. Characteristics include rhythmic jerking. Characteristics do not include eye blinking or eye deviation. The episode was witnessed. There was no sensation of an aura present. The seizures did not continue in the ED. The seizure(s) had no focality. There has been no fever.    Past Medical History:  Diagnosis Date  . Absence seizures, intractable (HCC)   . Aggressive behavior   . Autism disorder   . Chronic static encephalopathy   . Seizures [facility](HCC)     Patient Active Problem List   Diagnosis Date Noted  . Abnormal involuntary movement 05/24/2016  . Autism spectrum disorder with accompanying language impairment, requiring very substantial support (level 3) 10/20/2015  . 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 Partial epilepsy with impairment of consciousness (HCC) 10/20/2015  . Epilepsy (HCC) 08/31/2015  . Behavior disturbance 08/31/2015  . Autism 08/31/2015  . MR (mental retardation) 08/31/2015    History reviewed. No pertinent surgical history.     Home Medications    Prior to Admission medications   Medication Sig Start Date End Date Taking? Authorizing Provider  ABILIFY 10 MG tablet Take 10 mg by mouth 2 (two) times daily. 07/06/15   Historical Provider, MD  cetirizine (ZYRTEC) 10 MG tablet Take 10 mg by mouth daily.    Historical Provider, MD  chlorproMAZINE (THORAZINE) 100 MG tablet Take 100-200 mg by mouth 2 (two) times daily. Take 2 tablets in the morning and 1  tablet in the evening    Historical Provider, MD  chlorproMAZINE (THORAZINE) 200 MG tablet Take 200 mg by mouth 2 (two) times daily. 05/17/16   Historical Provider, MD  chlorproMAZINE (THORAZINE) 50 MG tablet TAKE 1 TABLET BY MOUTH EVERY 4 HOURS AS NEEDED FOR AGITATION 10/03/15   Historical Provider, MD  doxycycline (VIBRA-TABS) 100 MG tablet Take 100 mg by mouth 2 (two) times daily. 05/28/16   Historical Provider, MD  levETIRAcetam (KEPPRA) 500 MG tablet Take 2 tablets twice daily Patient taking differently: Take 1,000 mg by mouth 2 (two) times daily.  01/18/16   Deetta PerlaWilliam H Hickling, MD  ONFI 10 MG tablet TAKE ONE AND A HALF TABLETS BY MOUTH 2 TIMES DAILY 06/05/16   Elveria Risingina Goodpasture, NP  propranolol (INDERAL) 80 MG tablet Take 80 mg by mouth 3 (three) times daily. 07/06/15   Historical Provider, MD    Family History History reviewed. No pertinent family history.  Social History Social History  Substance Use Topics  . Smoking status: Never Smoker  . Smokeless tobacco: Never Used  . Alcohol use No     Allergies   Review of patient's allergies indicates not on file.   Review of Systems Review of Systems  Unable to perform ROS: Patient nonverbal  Cardiovascular: Negative for chest pain.  Neurological: Positive for seizures.     Physical Exam Updated Vital Signs BP 120/81   Pulse 79   Resp 20   SpO2 98%   Physical Exam  Constitutional: He appears  well-developed and well-nourished.  HENT:  Head: Normocephalic and atraumatic.  Eyes: Conjunctivae are normal.  Neck: Neck supple.  Cardiovascular: Normal rate and regular rhythm.   No murmur heard. Pulmonary/Chest: Effort normal and breath sounds normal. No respiratory distress.  Abdominal: Soft. There is no tenderness.  Musculoskeletal: He exhibits no edema.  Neurological: He is alert. No cranial nerve deficit. Coordination abnormal.  Skin: Skin is warm and dry.  Psychiatric: He has a normal mood and affect.  Nursing note and  vitals reviewed.    ED Treatments / Results  Labs (all labs ordered are listed, but only abnormal results are displayed) Labs Reviewed  BASIC METABOLIC PANEL - Abnormal; Notable for the following:       Result Value   Glucose, Bld 106 (*)    All other components within normal limits  CBC WITH DIFFERENTIAL/PLATELET - Abnormal; Notable for the following:    Platelets 145 (*)    Neutro Abs 7.8 (*)    All other components within normal limits    EKG  EKG Interpretation None       Radiology No results found.  Procedures Procedures (including critical care time)  Medications Ordered in ED Medications  levETIRAcetam (KEPPRA) 1,000 mg in sodium chloride 0.9 % 100 mL IVPB (0 mg Intravenous Stopped 06/11/16 1846)     Initial Impression / Assessment and Plan / ED Course  I have reviewed the triage vital signs and the nursing notes.  Pertinent labs & imaging results that were available during my care of the patient were reviewed by me and considered in my medical decision making (see chart for details).  Clinical Course   Per review of the records, patient on thorazine and keppra at home, will give dose of each. Will observe, perform wound care when his restlessness improves.   Observed for multiple hours, reloaded keppra, plan to discharge back to facility and will need neurologic follow up.   Final Clinical Impressions(s) / ED Diagnoses   Final diagnoses:  Seizure [facility])    New Prescriptions Discharge Medication List as of 06/11/2016  9:28 PM       Marily Memos, MD 06/12/16 0021

## 2016-06-11 NOTE — ED Triage Notes (Signed)
Patient came home today from school and while cleaning up from school he had a seizure.  Home care taker called EMS and they transported here.  In route patient had another seizure.  EMS gave 5mg  midazolam and 5mg  haldol.  Patient is a high functioning autistic.  When seizure have happened in the past patient has bitten and ripped at fingers.  Bilateral fingers are bloody and have some skin abrasions. Patient is non verbal.

## 2016-06-14 ENCOUNTER — Other Ambulatory Visit: Payer: Self-pay | Admitting: Pediatrics

## 2016-06-14 DIAGNOSIS — G40209 Localization-related (focal) (partial) symptomatic epilepsy and epileptic syndromes with complex partial seizures, not intractable, without status epilepticus: Secondary | ICD-10-CM

## 2016-06-15 MED ORDER — LEVETIRACETAM 500 MG PO TABS: ORAL_TABLET | ORAL | 2 refills | Status: DC

## 2016-06-22 ENCOUNTER — Emergency Department (HOSPITAL_COMMUNITY)
Admission: EM | Admit: 2016-06-22 | Discharge: 2016-06-22 | Disposition: A | Discharge status: Home / Self Care | Payer: Medicaid Other | Attending: Emergency Medicine | Admitting: Emergency Medicine

## 2016-06-22 ENCOUNTER — Encounter (HOSPITAL_COMMUNITY): Payer: Self-pay

## 2016-06-22 DIAGNOSIS — Z23 Encounter for immunization: Secondary | ICD-10-CM | POA: Insufficient documentation

## 2016-06-22 DIAGNOSIS — G40909 Epilepsy, unspecified, not intractable, without status epilepticus: Secondary | ICD-10-CM | POA: Diagnosis not present

## 2016-06-22 DIAGNOSIS — Z79899 Other long term (current) drug therapy: Secondary | ICD-10-CM | POA: Insufficient documentation

## 2016-06-22 DIAGNOSIS — F79 Unspecified intellectual disabilities: Secondary | ICD-10-CM | POA: Insufficient documentation

## 2016-06-22 DIAGNOSIS — F840 Autistic disorder: Secondary | ICD-10-CM | POA: Diagnosis not present

## 2016-06-22 DIAGNOSIS — R569 Unspecified convulsions: Secondary | ICD-10-CM | POA: Diagnosis present

## 2016-06-22 LAB — CBG MONITORING, ED: Glucose-Capillary: 90 mg/dL (ref 65–99)

## 2016-06-22 MED ORDER — LORAZEPAM 2 MG/ML IJ SOLN
INTRAMUSCULAR | Status: AC
  Filled 2016-06-22: qty 1

## 2016-06-22 MED ORDER — LORAZEPAM 2 MG/ML IJ SOLN
1.0000 mg | Freq: Once | INTRAMUSCULAR | Status: AC
  Administered 2016-06-22: 1 mg via INTRAVENOUS

## 2016-06-22 MED ORDER — TETANUS-DIPHTH-ACELL PERTUSSIS 5-2.5-18.5 LF-MCG/0.5 IM SUSP
0.5000 mL | Freq: Once | INTRAMUSCULAR | Status: AC
  Administered 2016-06-22: 0.5 mL via INTRAMUSCULAR
  Filled 2016-06-22: qty 0.5

## 2016-06-22 MED ORDER — BACITRACIN ZINC 500 UNIT/GM EX OINT
TOPICAL_OINTMENT | CUTANEOUS | Status: AC
  Administered 2016-06-22: 17:00:00
  Filled 2016-06-22: qty 0.9

## 2016-06-22 NOTE — Discharge Instructions (Signed)
Call Dr. Sharene SkeansHickling on Monday, 06/25/2016 to schedule an office appointment. Wash the wounds on his fingers daily with soap and water and place a thin layer of bacitracin ointment over the wounds. Signs of infection include redness around the wound, drainage from the wounds, fever or swelling. See Dr.Osei-bonsu or an urgent care Center or return if you think he might be developing infection

## 2016-06-22 NOTE — ED Notes (Signed)
Bed: WA09 Expected date:  Expected time:  Means of arrival:  Comments: EMS 

## 2016-06-22 NOTE — ED Provider Notes (Signed)
[facility]-EMERGENCY DEPT Provider Note   CSN: [id number] Arrival date & time: 06/22/16  1613     History   Chief Complaint Chief Complaint  Patient presents with  . Seizures   level 5 caveat patient mentally retarded . History from paramedics and from Mr. Nathan Moore young, supervisor group home via telephone HPI Nathan Moore is a 20 y.o. male.ppatient had generalized seizure today 3:30 PM which lasted 10-12 minutes. No treatment prior to coming here. Patient has not missed any of his medications. He was doing well prior to the seizure.  HPI  Past Medical History:  Diagnosis Date  . Absence seizures, intractable (HCC)   . Aggressive behavior   . Autism disorder   . Chronic static encephalopathy   . Seizures [facility](HCC)     Patient Active Problem List   Diagnosis Date Noted  . Abnormal involuntary movement 05/24/2016  . Autism spectrum disorder with accompanying language impairment, requiring very substantial support (level 3) 10/20/2015  . 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 Partial epilepsy with impairment of consciousness (HCC) 10/20/2015  . Epilepsy (HCC) 08/31/2015  . Behavior disturbance 08/31/2015  . Autism 08/31/2015  . MR (mental retardation) 08/31/2015    History reviewed. No pertinent surgical history.     Home Medications    Prior to Admission medications   Medication Sig Start Date End Date Taking? Authorizing Provider  ABILIFY 10 MG tablet Take 10 mg by mouth 2 (two) times daily. 07/06/15   Historical Provider, MD  cetirizine (ZYRTEC) 10 MG tablet Take 10 mg by mouth daily.    Historical Provider, MD  chlorproMAZINE (THORAZINE) 100 MG tablet Take 100-200 mg by mouth 2 (two) times daily. Take 2 tablets in the morning and 1 tablet in the evening    Historical Provider, MD  chlorproMAZINE (THORAZINE) 200 MG tablet Take 200 mg by mouth 2 (two) times daily. 05/17/16   Historical Provider, MD  chlorproMAZINE (THORAZINE) 50 MG tablet TAKE 1 TABLET BY MOUTH EVERY 4  HOURS AS NEEDED FOR AGITATION 10/03/15   Historical Provider, MD  doxycycline (VIBRA-TABS) 100 MG tablet Take 100 mg by mouth 2 (two) times daily. 05/28/16   Historical Provider, MD  levETIRAcetam (KEPPRA) 500 MG tablet Take 2 tablets twice per day 06/15/16   Deetta PerlaWilliam H Hickling, MD  ONFI 10 MG tablet TAKE ONE AND A HALF TABLETS BY MOUTH 2 TIMES DAILY 06/05/16   Elveria Risingina Goodpasture, NP  propranolol (INDERAL) 80 MG tablet Take 80 mg by mouth 3 (three) times daily. 07/06/15   Historical Provider, MD    Family History No family history on file.  Social History Social History  Substance Use Topics  . Smoking status: Never Smoker  . Smokeless tobacco: Never Used  . Alcohol use No     Allergies   Review of patient's allergies indicates no known allergies.   Review of Systems Review of Systems  Unable to perform ROS: Other  Skin: Positive for wound.       Wounds at cuticles from pt's picking at skin with fingernails  Neurological: Positive for seizures and speech difficulty.   atient mentally retarded, nonverbal Physical Exam Updated Vital Signs BP 115/75 (BP Location: Right Arm)   Pulse 73   Temp 97.6 F (36.4 C) (Oral)   Resp 18   SpO2 100%   Physical Exam  Constitutional: He appears well-developed and well-nourished. No distress.  HENT:  Head: Normocephalic and atraumatic.  Right Ear: External ear normal.  Left Ear: External ear normal.  Eyes:  Conjunctivae are normal. Pupils are equal, round, and reactive to light.  Neck: Neck supple. No tracheal deviation present. No thyromegaly present.  Cardiovascular: Normal rate and regular rhythm.   No murmur heard. Pulmonary/Chest: Effort normal and breath sounds normal.  Abdominal: Soft. Bowel sounds are normal. He exhibits no distension. There is no tenderness.  Musculoskeletal: Normal range of motion. He exhibits no edema or tenderness.  Neurological: He is alert.  Moves all extremities.. Motor strength 5 over 5 overall  Skin: Skin  is warm and dry. No rash noted.  Abrasions around cuticles of several fingertips no drainage or redness around the wounds, and no signs of infection  Psychiatric: He has a normal mood and affect.  Nursing note and vitals reviewed.    ED Treatments / Results  Labs (all labs ordered are listed, but only abnormal results are displayed) Labs Reviewed  CBG MONITORING, ED    EKG  EKG Interpretation None       Radiology No results found.  Procedures Procedures (including critical care time)  Medications Ordered in ED Medications  Tdap (BOOSTRIX) injection 0.5 mL (not administered)  bacitracin 500 UNIT/GM ointment (  Given 06/22/16 1705)   1750 p.m. Patient became acutely agitated. Administered Ativan 1 mg IM. At 1820 p.m. Nathan Moore is here from the group home and states that patient is acting as his normal self.  Patient's nails were trimmed and antibiotic ointment was placed on wounds on his fingers  Initial Impression / Assessment and Plan / ED Course  I have reviewed the triage vital signs and the nursing notes. Plan follow-up Dr. Consuello Masse outpatientllocal wound careabrasions ono fingers Pertinent labs & imaging results that were available during my care of the patient were reviewed by me and considered in my medical decision making (see chart for details).  Clinical Course      Results for orders placed or performed during the hospital encounter of 06/22/16  CBG monitoring, ED  Result Value Ref Range   Glucose-Capillary 90 65 - 99 mg/dL   No results found.  Final Clinical Impressions(s) / ED Diagnoses  iagnosis #1 seizure disorder #2abrasions to fingers Final diagnoses:  None    New Prescriptions New Prescriptions   No medications on file     Nathan Sou, MD 06/22/16 1829
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
## 2016-06-22 NOTE — ED Notes (Signed)
Spoke with caregiver and he is en route to pick up patient.

## 2016-06-22 NOTE — ED Notes (Addendum)
Ativan 1mg  given L Deltoid IM

## 2016-06-27 ENCOUNTER — Emergency Department (HOSPITAL_COMMUNITY): Payer: Medicaid Other

## 2016-06-27 ENCOUNTER — Encounter (HOSPITAL_COMMUNITY): Payer: Self-pay | Admitting: Emergency Medicine

## 2016-06-27 ENCOUNTER — Telehealth: Payer: Self-pay

## 2016-06-27 ENCOUNTER — Inpatient Hospital Stay (HOSPITAL_COMMUNITY)
Admission: EM | Admit: 2016-06-27 | Discharge: 2016-07-05 | DRG: 603 | Disposition: A | Discharge status: Home / Self Care | Payer: Medicaid Other | Attending: Internal Medicine | Admitting: Internal Medicine

## 2016-06-27 DIAGNOSIS — F840 Autistic disorder: Secondary | ICD-10-CM | POA: Diagnosis present

## 2016-06-27 DIAGNOSIS — L03114 Cellulitis of left upper limb: Secondary | ICD-10-CM | POA: Diagnosis present

## 2016-06-27 DIAGNOSIS — Z79899 Other long term (current) drug therapy: Secondary | ICD-10-CM

## 2016-06-27 DIAGNOSIS — G40209 Localization-related (focal) (partial) symptomatic epilepsy and epileptic syndromes with complex partial seizures, not intractable, without status epilepticus: Secondary | ICD-10-CM | POA: Diagnosis present

## 2016-06-27 DIAGNOSIS — R197 Diarrhea, unspecified: Secondary | ICD-10-CM | POA: Diagnosis not present

## 2016-06-27 DIAGNOSIS — F809 Developmental disorder of speech and language, unspecified: Secondary | ICD-10-CM | POA: Diagnosis present

## 2016-06-27 DIAGNOSIS — B9561 Methicillin susceptible Staphylococcus aureus infection as the cause of diseases classified elsewhere: Secondary | ICD-10-CM | POA: Diagnosis present

## 2016-06-27 DIAGNOSIS — L02519 Cutaneous abscess of unspecified hand: Secondary | ICD-10-CM | POA: Diagnosis not present

## 2016-06-27 DIAGNOSIS — B964 Proteus (mirabilis) (morganii) as the cause of diseases classified elsewhere: Secondary | ICD-10-CM | POA: Diagnosis present

## 2016-06-27 DIAGNOSIS — F71 Moderate intellectual disabilities: Secondary | ICD-10-CM | POA: Diagnosis present

## 2016-06-27 DIAGNOSIS — G40909 Epilepsy, unspecified, not intractable, without status epilepticus: Secondary | ICD-10-CM | POA: Diagnosis present

## 2016-06-27 DIAGNOSIS — L03119 Cellulitis of unspecified part of limb: Secondary | ICD-10-CM

## 2016-06-27 DIAGNOSIS — L03113 Cellulitis of right upper limb: Principal | ICD-10-CM | POA: Diagnosis present

## 2016-06-27 DIAGNOSIS — R451 Restlessness and agitation: Secondary | ICD-10-CM | POA: Diagnosis present

## 2016-06-27 DIAGNOSIS — F919 Conduct disorder, unspecified: Secondary | ICD-10-CM | POA: Diagnosis present

## 2016-06-27 LAB — URINALYSIS, ROUTINE W REFLEX MICROSCOPIC
BILIRUBIN URINE: NEGATIVE
GLUCOSE, UA: NEGATIVE mg/dL
HGB URINE DIPSTICK: NEGATIVE
Ketones, ur: 40 mg/dL — AB
Leukocytes, UA: NEGATIVE
Nitrite: NEGATIVE
PH: 5.5 (ref 5.0–8.0)
Protein, ur: NEGATIVE mg/dL
SPECIFIC GRAVITY, URINE: 1.023 (ref 1.005–1.030)

## 2016-06-27 LAB — CBC WITH DIFFERENTIAL/PLATELET
Basophils Absolute: 0 10*3/uL (ref 0.0–0.1)
Basophils Relative: 0 %
EOS ABS: 0.1 10*3/uL (ref 0.0–0.7)
Eosinophils Relative: 0 %
HCT: 40.1 % (ref 39.0–52.0)
HEMOGLOBIN: 14 g/dL (ref 13.0–17.0)
LYMPHS ABS: 1.5 10*3/uL (ref 0.7–4.0)
Lymphocytes Relative: 13 %
MCH: 29.3 pg (ref 26.0–34.0)
MCHC: 34.9 g/dL (ref 30.0–36.0)
MCV: 83.9 fL (ref 78.0–100.0)
MONOS PCT: 10 %
Monocytes Absolute: 1.2 10*3/uL — ABNORMAL HIGH (ref 0.1–1.0)
NEUTROS PCT: 77 %
Neutro Abs: 9 10*3/uL — ABNORMAL HIGH (ref 1.7–7.7)
Platelets: 146 10*3/uL — ABNORMAL LOW (ref 150–400)
RBC: 4.78 MIL/uL (ref 4.22–5.81)
RDW: 12.3 % (ref 11.5–15.5)
WBC: 11.8 10*3/uL — ABNORMAL HIGH (ref 4.0–10.5)

## 2016-06-27 LAB — COMPREHENSIVE METABOLIC PANEL
ALBUMIN: 3.8 g/dL (ref 3.5–5.0)
ALK PHOS: 65 U/L (ref 38–126)
ALT: 18 U/L (ref 17–63)
ANION GAP: 8 (ref 5–15)
AST: 16 U/L (ref 15–41)
BUN: 9 mg/dL (ref 6–20)
CALCIUM: 8.6 mg/dL — AB (ref 8.9–10.3)
CO2: 25 mmol/L (ref 22–32)
CREATININE: 0.73 mg/dL (ref 0.61–1.24)
Chloride: 104 mmol/L (ref 101–111)
GFR calc Af Amer: >60 mL/min (ref >60)
GFR calc non Af Amer: >60 mL/min (ref >60)
GLUCOSE: 96 mg/dL (ref 65–99)
Potassium: 3.3 mmol/L — ABNORMAL LOW (ref 3.5–5.1)
SODIUM: 137 mmol/L (ref 135–145)
Total Bilirubin: 1.2 mg/dL (ref 0.3–1.2)
Total Protein: 6.6 g/dL (ref 6.5–8.1)

## 2016-06-27 LAB — I-STAT CG4 LACTIC ACID, ED: Lactic Acid, Venous: 0.94 mmol/L (ref 0.5–1.9)

## 2016-06-27 MED ORDER — VANCOMYCIN HCL 10 G IV SOLR
1500.0000 mg | Freq: Two times a day (BID) | INTRAVENOUS | Status: DC
  Administered 2016-06-27 – 2016-06-28 (×3): 1500 mg via INTRAVENOUS
  Filled 2016-06-27 (×4): qty 1500

## 2016-06-27 MED ORDER — CHLORPROMAZINE HCL 100 MG PO TABS
200.0000 mg | ORAL_TABLET | Freq: Every day | ORAL | Status: DC
  Administered 2016-06-27 – 2016-07-04 (×8): 200 mg via ORAL
  Filled 2016-06-27 (×9): qty 2

## 2016-06-27 MED ORDER — ACETAMINOPHEN 650 MG RE SUPP: 650.0000 mg | Freq: Four times a day (QID) | RECTAL | Status: DC | PRN

## 2016-06-27 MED ORDER — PROPRANOLOL HCL 80 MG PO TABS
80.0000 mg | ORAL_TABLET | Freq: Three times a day (TID) | ORAL | Status: DC
  Administered 2016-06-27 – 2016-07-05 (×23): 80 mg via ORAL
  Filled 2016-06-27 (×25): qty 1

## 2016-06-27 MED ORDER — CHLORPROMAZINE HCL 50 MG PO TABS
50.0000 mg | ORAL_TABLET | Freq: Four times a day (QID) | ORAL | Status: DC | PRN
  Administered 2016-07-01: 50 mg via ORAL
  Filled 2016-06-27 (×3): qty 1

## 2016-06-27 MED ORDER — SODIUM CHLORIDE 0.9 % IV BOLUS (SEPSIS)
1000.0000 mL | Freq: Once | INTRAVENOUS | Status: AC
  Administered 2016-06-27: 1000 mL via INTRAVENOUS

## 2016-06-27 MED ORDER — CLOBAZAM 10 MG PO TABS: 15.0000 mg | ORAL_TABLET | Freq: Two times a day (BID) | ORAL | Status: DC

## 2016-06-27 MED ORDER — CHLORPROMAZINE HCL 100 MG PO TABS
100.0000 mg | ORAL_TABLET | Freq: Every morning | ORAL | Status: DC
  Administered 2016-06-28 – 2016-07-05 (×8): 100 mg via ORAL
  Filled 2016-06-27 (×10): qty 1

## 2016-06-27 MED ORDER — LORATADINE 10 MG PO TABS
10.0000 mg | ORAL_TABLET | Freq: Every day | ORAL | Status: DC
  Administered 2016-06-28 – 2016-07-05 (×8): 10 mg via ORAL
  Filled 2016-06-27 (×8): qty 1

## 2016-06-27 MED ORDER — ACETAMINOPHEN 325 MG PO TABS
650.0000 mg | ORAL_TABLET | Freq: Once | ORAL | Status: AC | PRN
  Administered 2016-06-27: 650 mg via ORAL
  Filled 2016-06-27: qty 2

## 2016-06-27 MED ORDER — SODIUM CHLORIDE 0.9 % IV SOLN
1500.0000 mg | Freq: Once | INTRAVENOUS | Status: AC
  Administered 2016-06-27: 1500 mg via INTRAVENOUS
  Filled 2016-06-27: qty 1500

## 2016-06-27 MED ORDER — SODIUM CHLORIDE 0.9 % IV SOLN
Freq: Once | INTRAVENOUS | Status: AC
  Administered 2016-06-27: 13:00:00 via INTRAVENOUS

## 2016-06-27 MED ORDER — PIPERACILLIN-TAZOBACTAM 3.375 G IVPB
3.3750 g | Freq: Three times a day (TID) | INTRAVENOUS | Status: DC
  Administered 2016-06-27 – 2016-06-29 (×5): 3.375 g via INTRAVENOUS
  Filled 2016-06-27 (×6): qty 50

## 2016-06-27 MED ORDER — PIPERACILLIN-TAZOBACTAM 3.375 G IVPB 30 MIN
3.3750 g | Freq: Once | INTRAVENOUS | Status: AC
  Administered 2016-06-27: 3.375 g via INTRAVENOUS
  Filled 2016-06-27: qty 50

## 2016-06-27 MED ORDER — LEVETIRACETAM 500 MG PO TABS
1000.0000 mg | ORAL_TABLET | Freq: Two times a day (BID) | ORAL | Status: DC
  Administered 2016-06-27 – 2016-07-05 (×16): 1000 mg via ORAL
  Filled 2016-06-27 (×16): qty 2

## 2016-06-27 MED ORDER — ACETAMINOPHEN 325 MG PO TABS: 650.0000 mg | ORAL_TABLET | Freq: Four times a day (QID) | ORAL | Status: DC | PRN

## 2016-06-27 MED ORDER — CLOBAZAM 10 MG PO TABS
ORAL_TABLET | ORAL | Status: AC
  Administered 2016-06-27: 10 mg
  Filled 2016-06-27: qty 1

## 2016-06-27 MED ORDER — LIDOCAINE HCL 1 % IJ SOLN
20.0000 mL | Freq: Once | INTRAMUSCULAR | Status: DC
  Filled 2016-06-27 (×2): qty 20

## 2016-06-27 MED ORDER — ENOXAPARIN SODIUM 40 MG/0.4ML ~~LOC~~ SOLN
40.0000 mg | SUBCUTANEOUS | Status: DC
  Administered 2016-06-27 – 2016-07-04 (×8): 40 mg via SUBCUTANEOUS
  Filled 2016-06-27 (×9): qty 0.4

## 2016-06-27 NOTE — H&P (Signed)
History and Physical    Nathan Moore ZOX:[id number] DOB: 08-Nov-1995 DOA: 06/27/2016  Referring MD/NP/PA: ER- pickering PCP: Jackie Plum, MD Outpatient Specialists: Dr. Sharene Skeans Patient coming from: group home  Chief Complaint: wound on fingers  HPI: Nathan Moore is a 21 y.o. male with medical history significant of seizures and autism with behavioral disturbances.  History obtained from chart and caregiver at bedside as patient is non verbal at baseline. Patient does walk and feed self normally.   Patient has been having seizures more frequently in last few months.  After his seizures, patient tends to  Rub fingers together for at least 1 hour.  Caregiver has noticed him doing this more than usual.  He has also seen patient chewing on fingers.  Patient was seen on 9/8 in the ER and noticed to have abrasions to fingers at that time.  He was given a tenatuns shot and wound care.  He comes back to the ER with fever and worsening infection.    ED Course: In the ER, he was given tylenol for fever.  ER doc did small I/D of area with purulent material and this was sent for culture.  Ortho was consulted and patient was made NPO for now.  Given vanc/zosyn.    Review of Systems: all systems reviewed, negative unless stated above in HPI   Past Medical History:  Diagnosis Date  . Absence seizures, intractable (HCC)   . Aggressive behavior   . Autism disorder   . Chronic static encephalopathy   . Seizures (HCC)     History reviewed. No pertinent surgical history.   reports that he has never smoked. He has never used smokeless tobacco. He reports that he does not drink alcohol or use drugs.  No Known Allergies  Unable to review family hx-- patient non-verbal and autistic   Prior to Admission medications   Medication Sig Start Date End Date Taking? Authorizing Provider  cetirizine (ZYRTEC) 10 MG tablet Take 10 mg by mouth daily.   Yes Historical Provider, MD  chlorproMAZINE  (THORAZINE) 100 MG tablet Take 100-200 mg by mouth 2 (two) times daily. Take 100mg  in the morning and then 200mg  at night   Yes Historical Provider, MD  chlorproMAZINE (THORAZINE) 50 MG tablet TAKE 1 TABLET BY MOUTH EVERY 4 HOURS AS NEEDED FOR AGITATION 10/03/15  Yes Historical Provider, MD  doxycycline (VIBRA-TABS) 100 MG tablet Take 100 mg by mouth 2 (two) times daily. 05/28/16  Yes Historical Provider, MD  levETIRAcetam (KEPPRA) 500 MG tablet Take 2 tablets twice per day Patient taking differently: Take 1,000 mg by mouth 2 (two) times daily.  06/15/16  Yes Deetta Perla, MD  ONFI 10 MG tablet TAKE ONE AND A HALF TABLETS BY MOUTH 2 TIMES DAILY Patient taking differently: TAKE ONE AND A HALF TABLETS= 15mg  BY MOUTH 2 TIMES DAILY 06/05/16  Yes Elveria Rising, NP  propranolol (INDERAL) 80 MG tablet Take 80 mg by mouth 3 (three) times daily. 07/06/15  Yes Historical Provider, MD    Physical Exam: Vitals:   06/27/16 1207 06/27/16 1235 06/27/16 1312 06/27/16 1327  BP: 123/78 130/79 112/68 112/68  Pulse: 106 110 108 111  Resp: 19 20 26 22   Temp:    101.6 F (38.7 C)  TempSrc:    Rectal  SpO2: 99% 98% 97% 97%  Weight:      Height:          Constitutional: non-verbal- coughed in my face throughout exam Vitals:   06/27/16 1207 06/27/16  1235 06/27/16 1312 06/27/16 1327  BP: 123/78 130/79 112/68 112/68  Pulse: 106 110 108 111  Resp: 19 20 26 22   Temp:    101.6 F (38.7 C)  TempSrc:    Rectal  SpO2: 99% 98% 97% 97%  Weight:      Height:       Eyes: PERRL, lids and conjunctivae normal Neck: normal, supple, no masses, no thyromegaly Respiratory: clear to auscultation bilaterally, no wheezing, no crackles. Normal respiratory effort. No accessory muscle use.  Cardiovascular: tachy, no murmurs / rubs / gallops. No extremity edema. 2+ pedal pulses. No carotid bruits.  Abdomen: no tenderness, no masses palpated. No hepatosplenomegaly. Bowel sounds positive.  Musculoskeletal: no clubbing /  cyanosis. No joint deformity upper and lower extremities. Good ROM, no contractures. Normal muscle tone.  Skin: acne on face-- hand clenched with large areas of redness extending from finger to wrist area-- see ER pics Neurologic: not following commands Psychiatric: Non-verbal at baseline    Labs on Admission: I have personally reviewed following labs and imaging studies  CBC:  Recent Labs Lab 06/27/16 1158  WBC 11.8*  NEUTROABS 9.0*  HGB 14.0  HCT 40.1  MCV 83.9  PLT 146*   Basic Metabolic Panel:  Recent Labs Lab 06/27/16 1158  NA 137  K 3.3*  CL 104  CO2 25  GLUCOSE 96  BUN 9  CREATININE 0.73  CALCIUM 8.6*   GFR: Estimated Creatinine Clearance: 163 mL/min (by C-G formula based on SCr of 0.73 mg/dL). Liver Function Tests:  Recent Labs Lab 06/27/16 1158  AST 16  ALT 18  ALKPHOS 65  BILITOT 1.2  PROT 6.6  ALBUMIN 3.8   No results for input(s): LIPASE, AMYLASE in the last 168 hours. No results for input(s): AMMONIA in the last 168 hours. Coagulation Profile: No results for input(s): INR, PROTIME in the last 168 hours. Cardiac Enzymes: No results for input(s): CKTOTAL, CKMB, CKMBINDEX, TROPONINI in the last 168 hours. BNP (last 3 results) No results for input(s): PROBNP in the last 8760 hours. HbA1C: No results for input(s): HGBA1C in the last 72 hours. CBG:  Recent Labs Lab 06/22/16 1716  GLUCAP 90   Lipid Profile: No results for input(s): CHOL, HDL, LDLCALC, TRIG, CHOLHDL, LDLDIRECT in the last 72 hours. Thyroid Function Tests: No results for input(s): TSH, T4TOTAL, FREET4, T3FREE, THYROIDAB in the last 72 hours. Anemia Panel: No results for input(s): VITAMINB12, FOLATE, FERRITIN, TIBC, IRON, RETICCTPCT in the last 72 hours. Urine analysis:    Component Value Date/Time   COLORURINE YELLOW 04/24/2016 1502   APPEARANCEUR CLEAR 04/24/2016 1502   LABSPEC 1.021 04/24/2016 1502   PHURINE 6.5 04/24/2016 1502   GLUCOSEU NEGATIVE 04/24/2016 1502    HGBUR NEGATIVE 04/24/2016 1502   BILIRUBINUR NEGATIVE 04/24/2016 1502   KETONESUR NEGATIVE 04/24/2016 1502   PROTEINUR NEGATIVE 04/24/2016 1502   NITRITE NEGATIVE 04/24/2016 1502   LEUKOCYTESUR NEGATIVE 04/24/2016 1502   Sepsis Labs: Invalid input(s): PROCALCITONIN, LACTICIDVEN No results found for this or any previous visit (from the past 240 hour(s)).   Radiological Exams on Admission: Dg Chest Portable 1 View  Result Date: 06/27/2016 CLINICAL DATA:  Sepsis.  Autism. EXAM: PORTABLE CHEST 1 VIEW COMPARISON:  None. FINDINGS: The heart size and mediastinal contours are within normal limits. Both lungs are clear. The visualized skeletal structures are unremarkable. IMPRESSION: No active disease. Electronically Signed   By: Myles Rosenthal M.D.   On: 06/27/2016 12:31   Dg Hand Complete Right  Result Date:  06/27/2016 CLINICAL DATA:  Swelling, erythema, and whose in wounds of the right index and middle fingers. The patient has been scratching this region. EXAM: RIGHT HAND - COMPLETE 3+ VIEW COMPARISON:  None in PACs FINDINGS: There is marked soft tissue swelling of the index finger with milder swelling of the middle finger. No foreign bodies or gas collections are observed. No underlying bony changes are demonstrated. Elsewhere the bones of the right hand are normal. IMPRESSION: Findings compatible with cellulitis of the index and third fingers. No evidence of osteomyelitis. Electronically Signed   By: David  Swazil[place]Jordan M.D.   On: 06/27/2016 12:33      Assessment/Plan Active Problems:   Behavior disturbance   Autism   Autism spectrum disorder with accompanying language impairment, requiring very substantial support (level 3)   Partial epilepsy with impairment of consciousness (HCC)   Cellulitis and abscess of hand    Cellulitis of hands (no osteo on x ray) -from patient chewing as well as picking at fingers -vanc/zosyn -small I/D at bedside by ER doc-- sent for culture -Dr. Rayburn MaBlackmon to  see  Uncontrolled seizures -follows with Dr. Sharene SkeansHickling -no recent medication changes  Autism with behavioral disturbances -chews on fingers as well as rubs fingers together -may need psych consult vs behavior modifications-- gloves (has one now that just covers thumb but leaves finger free and patient chews through glove)   DVT prophylaxis: lovenox Code Status: full Family Communication: caregiver from group home at bedside Disposition Plan: ? If group home can handle Consults called: ortho general-- no hand on call Admission status: inpt   Charmian Forbis U Nathan Flink DO [facility] Pager [phone]  If 7PM-7AM, please contact night-coverage [email or web address] Password [email or web address]  06/27/2016, 1:41 PM

## 2016-06-27 NOTE — Progress Notes (Addendum)
CSW spoke with Con MemosAlex Young, QP Medical laboratory scientific officer(Qualified Professional) from [facility], as patient is non-verbal and autistic. He stated he took patient to [facility], due to his fingers being swollen. He stated the Nurse Practioner at the Urgent Care informed him to bring patient to the hospital, therefore, he stated he called the ambulance. He stated patient will rub his fingers together until the skin is broken. He stated patient has "really bad seizures" and patient had a seizure last night. He stated patient has a behavior where he will rub his fingers together until they bleed and he will "throw blood around". He stated patient will do this with his fingers after he has seizures. He stated has never seen patient's fingers this bad and when he saw them, he made the decision to take patient to Urgent Care. He stated patient is seen by Dr. Sharene SkeansHickling, Neurosurgeon and he adjusted his medications, which he states decreased the seizures to less than what they had been occurring. He stated since patient's [birth year] on [birth year], he does not know what has happened regarding patient's behavior. He stated "they have problems at times identifying seizures and actual behaviors". He stated at the group home, they have 1st, 2nd, and 3rd shifts that rotate and this is the same for weekend staff. He stated he works 1st shift duty. He stated patient [facility]ends [facility] [employer], [place], [place].   CSW left message for return call from [facility] [employer] [employer] regarding making a report.   [facility]   [address] [place], [place]  Owner, Theotis Burrowracy Martin Jones [phone]    Elenore PaddyLaVonia [name], LCSWA [phone] ED CSW 06/27/2016 3:19 PM

## 2016-06-27 NOTE — ED Notes (Signed)
Bed: [id number] Expected date:  Expected time:  Means of arrival:  Comments: Autistic swollen hand

## 2016-06-27 NOTE — Progress Notes (Signed)
Pharmacy Antibiotic Note  Nathan Moore is a 20 y.o. male admitted on 06/27/2016 with finger wound infections.  Pharmacy has been consulted for Vancomycin and Zosyn dosing.  Plan: Vancomycin 1500 mg IV every 12 hours.  Goal trough 10-15 mcg/mL. Zosyn 3.375g IV q8h (4 hour infusion).  F/u SCr, VT, clinical course.  Height: 6' (182.9 cm) Weight: 180 lb (81.6 kg) IBW/kg (Calculated) : 77.6  Temp (24hrs), Avg:101 F (38.3 C), Min:99.7 F (37.6 C), Max:102.3 F (39.1 C)  No results for input(s): WBC, CREATININE, LATICACIDVEN, VANCOTROUGH, VANCOPEAK, VANCORANDOM, GENTTROUGH, GENTPEAK, GENTRANDOM, TOBRATROUGH, TOBRAPEAK, TOBRARND, AMIKACINPEAK, AMIKACINTROU, AMIKACIN in the last 168 hours.  Estimated Creatinine Clearance: 144.9 mL/min (by C-G formula based on SCr of 0.9 mg/dL).    No Known Allergies  Antimicrobials this admission:  9/13 Zosyn >>  9/13 Vancomycin >>   Dose adjustments this admission:  -  Microbiology results:  9/13 BCx: sent 9/13 UCx: sent   Thank you for allowing pharmacy to be a part of this patient's care.  Nathan Moore, Nathan Moore 06/27/2016 12:06 PM
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
## 2016-06-27 NOTE — ED Triage Notes (Signed)
[facility] presents with a 20 yo male from group home with swelling and redness to hands bilaterally from patient repeatedly scratching thumb and hand areas bilaterally; left thumb, in particular, worse than other areas with dark red, gangrenous looking abrasions on nail of left thumb.  [facility] that patient is on doxycycline, however, group home staff could not report when patient had last dose.  100.3 F tympanic temp by [facility]; 102.3  Rectal temp presently.  VS stable.  Hx of autism, non-verbal and seizures.

## 2016-06-27 NOTE — Telephone Encounter (Signed)
I spoke with Trinna PostAlex.  It appears that Sharlet SalinaBenjamin had a seizure last night and began to rub his hands together into on them.  He now has a cellulitis and has to be admitted to the hospital for IV antibiotics.  I'm not certain how to protect his hands from repetitive injury.  We will see Sharlet SalinaBenjamin when he is out of the hospital.

## 2016-06-27 NOTE — Telephone Encounter (Signed)
Nathan Moore called in to cancel appointment for Nathan Moore tomorrow. He has been admitted to Nathan Moore. Nathan Moore is requesting a call back.   [phone]

## 2016-06-27 NOTE — ED Provider Notes (Signed)
[facility]-EMERGENCY DEPT Provider Note   CSN: [id number] Arrival date & time: 06/27/16  1108     History   Chief Complaint Chief Complaint  Patient presents with  . Hand Injury  . Fever   Level V caveat due to autism and nonverbal status. HPI Nathan Moore is a 20 y.o. male.  The history is provided by the patient.  Hand Injury   Associated symptoms include a fever.  Fever    Patient brought in for fevers and potential hand infections. Has wound on index finger of left hand and index finger right hand. Reportedly scrapes his fingers. Fevers of 102.3 here. Was seen in the ER 5 days ago and did not have these lesions. Patient cannot really provide any history.  Past Medical History:  Diagnosis Date  . Absence seizures, intractable (HCC)   . Aggressive behavior   . Autism disorder   . Chronic static encephalopathy   . Seizures [facility])     Patient Active Problem List   Diagnosis Date Noted  . Cellulitis and abscess of hand 06/27/2016  . Abnormal involuntary movement 05/24/2016  . Autism spectrum disorder with accompanying language impairment, requiring very substantial support (level 3) 10/20/2015  . 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 Partial epilepsy with impairment of consciousness (HCC) 10/20/2015  . Epilepsy (HCC) 08/31/2015  . Behavior disturbance 08/31/2015  . Autism 08/31/2015  . MR (mental retardation) 08/31/2015    History reviewed. No pertinent surgical history.     Home Medications    Prior to Admission medications   Medication Sig Start Date End Date Taking? Authorizing Provider  cetirizine (ZYRTEC) 10 MG tablet Take 10 mg by mouth daily.   Yes Historical Provider, MD  chlorproMAZINE (THORAZINE) 100 MG tablet Take 100-200 mg by mouth 2 (two) times daily. Take 100mg  in the morning and then 200mg  at night   Yes Historical Provider, MD  chlorproMAZINE (THORAZINE) 50 MG tablet TAKE 1 TABLET BY MOUTH EVERY 4 HOURS AS NEEDED FOR AGITATION 10/03/15   Yes Historical Provider, MD  doxycycline (VIBRA-TABS) 100 MG tablet Take 100 mg by mouth 2 (two) times daily. 05/28/16  Yes Historical Provider, MD  levETIRAcetam (KEPPRA) 500 MG tablet Take 2 tablets twice per day Patient taking differently: Take 1,000 mg by mouth 2 (two) times daily.  06/15/16  Yes Deetta Perla, MD  ONFI 10 MG tablet TAKE ONE AND A HALF TABLETS BY MOUTH 2 TIMES DAILY Patient taking differently: TAKE ONE AND A HALF TABLETS= 15mg  BY MOUTH 2 TIMES DAILY 06/05/16  Yes Elveria Rising, NP  propranolol (INDERAL) 80 MG tablet Take 80 mg by mouth 3 (three) times daily. 07/06/15  Yes Historical Provider, MD    Family History History reviewed. No pertinent family history.  Social History Social History  Substance Use Topics  . Smoking status: Never Smoker  . Smokeless tobacco: Never Used  . Alcohol use No     Allergies   Review of patient's allergies indicates no known allergies.   Review of Systems Review of Systems  Unable to perform ROS: Patient nonverbal  Constitutional: Positive for fever.     Physical Exam Updated Vital Signs BP 112/68   Pulse 108   Temp 102.3 F (39.1 C) (Rectal)   Resp 26   Ht 6' (1.829 m)   Wt 180 lb (81.6 kg)   SpO2 97%   BMI 24.41 kg/m   Physical Exam  Constitutional: He appears well-developed.  HENT:  Head: Atraumatic.  Eyes: EOM are normal.  Neck: Neck supple.  Cardiovascular:  Mild tachycardia.  Pulmonary/Chest: Effort normal.  Abdominal: There is no tenderness.  Musculoskeletal:  Erythema and swelling of right hand forearm and fingers. There is an ulcer on the lateral aspect of the right thumb distally. The right index finger is red and swollen with bulla and some purulence. There is discoloration. The left index finger distally wounds also with some erythema extending proximally.  Neurological:  Patient is baseline nonverbal.  Skin: Skin is warm.           ED Treatments / Results  Labs (all labs ordered  are listed, but only abnormal results are displayed) Labs Reviewed  COMPREHENSIVE METABOLIC PANEL - Abnormal; Notable for the following:       Result Value   Potassium 3.3 (*)    Calcium 8.6 (*)    All other components within normal limits  CBC WITH DIFFERENTIAL/PLATELET - Abnormal; Notable for the following:    WBC 11.8 (*)    Platelets 146 (*)    Neutro Abs 9.0 (*)    Monocytes Absolute 1.2 (*)    All other components within normal limits  CULTURE, BLOOD (ROUTINE X 2)  CULTURE, BLOOD (ROUTINE X 2)  URINE CULTURE  AEROBIC CULTURE (SUPERFICIAL SPECIMEN)  URINALYSIS, ROUTINE W REFLEX MICROSCOPIC (NOT AT [facility])  I-STAT CG4 LACTIC ACID, ED    EKG  EKG Interpretation None       Radiology Dg Chest Portable 1 View  Result Date: 06/27/2016 CLINICAL DATA:  Sepsis.  Autism. EXAM: PORTABLE CHEST 1 VIEW COMPARISON:  None. FINDINGS: The heart size and mediastinal contours are within normal limits. Both lungs are clear. The visualized skeletal structures are unremarkable. IMPRESSION: No active disease. Electronically Signed   By: Myles RosenthalJohn  Stahl M.D.   On: 06/27/2016 12:31   Dg Hand Complete Right  Result Date: 06/27/2016 CLINICAL DATA:  Swelling, erythema, and whose in wounds of the right index and middle fingers. The patient has been scratching this region. EXAM: RIGHT HAND - COMPLETE 3+ VIEW COMPARISON:  None in PACs FINDINGS: There is marked soft tissue swelling of the index finger with milder swelling of the middle finger. No foreign bodies or gas collections are observed. No underlying bony changes are demonstrated. Elsewhere the bones of the right hand are normal. IMPRESSION: Findings compatible with cellulitis of the index and third fingers. No evidence of osteomyelitis. Electronically Signed   By: David  SwazilandJordan M.D.   On: 06/27/2016 12:33    Procedures Procedures (including critical care time)  Medications Ordered in ED Medications  piperacillin-tazobactam (ZOSYN) IVPB 3.375 g (not  administered)  vancomycin (VANCOCIN) 1,500 mg in sodium chloride 0.9 % 500 mL IVPB (1,500 mg Intravenous New Bag/Given 06/27/16 1231)  vancomycin (VANCOCIN) 1,500 mg in sodium chloride 0.9 % 500 mL IVPB (not administered)  acetaminophen (TYLENOL) tablet 650 mg (650 mg Oral Given 06/27/16 1206)  sodium chloride 0.9 % bolus 1,000 mL (1,000 mLs Intravenous New Bag/Given 06/27/16 1202)  piperacillin-tazobactam (ZOSYN) IVPB 3.375 g (3.375 g Intravenous New Bag/Given 06/27/16 1214)     Initial Impression / Assessment and Plan / ED Course  I have reviewed the triage vital signs and the nursing notes.  Pertinent labs & imaging results that were available during my care of the patient were reviewed by me and considered in my medical decision making (see chart for details).  Clinical Course   Patient with fever likely from his hand infections. Images above. Culture sent off right finger. Possible abscess. No  hand surgeon on call today. Discussed with orthopedic surgery, Dr. Rayburn Ma. Will admit to internal medicine.  Final Clinical Impressions(s) / ED Diagnoses   Final diagnoses:  Cellulitis and abscess of hand    New Prescriptions New Prescriptions   No medications on file     Benjiman Core, MD 06/27/16 1326
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
## 2016-06-27 NOTE — ED Notes (Addendum)
Went in as Consulting civil engineercharge RN with primary RN to assess pt and assist as needed. Pt had abrasions and cuts on his last visit from self-inflicted scratching with his fingernails. Wounds were cleaned, antibiotic ointment placed on the wounds, and several wounds covered with coband. Tetanus shot also given on this day. Band aid placed after tetanus shot was given appears to still be in place today and coband that is on pt's finger is severely soiled and unkempt. Instructions on cleaning wounds were given to caregiver at discharge. Wounds now appear infected and pt is febrile.

## 2016-06-27 NOTE — Consult Note (Signed)
I was able to see Nathan Moore at the bedside.  He has a chronic wound on his left thumb as well as blistering and a wound on his right index finger.  There is cellulitis associated with his right hand and red streaking up his arm.  He has been started on IV antibiotics.  The x-rays are reviewed and show no destruction of the bone or evidence of osteo.  I was able to unroof the right index finger blisters at the bedside and found no gross purulence.  I then cleaned the wounds and placed Xeroform and well padded dressings.  The key will be appropriate wound care as well as keeping his fingers away from his face/mouth or to keep him from picking at the wounds.  He will certainly need IV antibiotics until the cellulitis resolves.  I will follow closely.

## 2016-06-28 ENCOUNTER — Inpatient Hospital Stay: Payer: Medicaid Other | Admitting: Pediatrics

## 2016-06-28 LAB — BASIC METABOLIC PANEL
Anion gap: 7 (ref 5–15)
BUN: 6 mg/dL (ref 6–20)
CHLORIDE: 108 mmol/L (ref 101–111)
CO2: 24 mmol/L (ref 22–32)
CREATININE: 0.7 mg/dL (ref 0.61–1.24)
Calcium: 8.2 mg/dL — ABNORMAL LOW (ref 8.9–10.3)
Glucose, Bld: 96 mg/dL (ref 65–99)
POTASSIUM: 3.2 mmol/L — AB (ref 3.5–5.1)
SODIUM: 139 mmol/L (ref 135–145)

## 2016-06-28 LAB — CBC
HEMATOCRIT: 37.2 % — AB (ref 39.0–52.0)
Hemoglobin: 12.8 g/dL — ABNORMAL LOW (ref 13.0–17.0)
MCH: 29.2 pg (ref 26.0–34.0)
MCHC: 34.4 g/dL (ref 30.0–36.0)
MCV: 84.7 fL (ref 78.0–100.0)
PLATELETS: 142 10*3/uL — AB (ref 150–400)
RBC: 4.39 MIL/uL (ref 4.22–5.81)
RDW: 12.3 % (ref 11.5–15.5)
WBC: 10.9 10*3/uL — AB (ref 4.0–10.5)

## 2016-06-28 LAB — URINE CULTURE: Culture: NO GROWTH

## 2016-06-28 MED ORDER — CLOBAZAM 10 MG PO TABS
15.0000 mg | ORAL_TABLET | Freq: Two times a day (BID) | ORAL | Status: DC
  Administered 2016-06-28 – 2016-07-05 (×15): 15 mg via ORAL
  Filled 2016-06-28: qty 1.5
  Filled 2016-06-28 (×14): qty 2

## 2016-06-28 MED ORDER — POTASSIUM CHLORIDE CRYS ER 20 MEQ PO TBCR
30.0000 meq | EXTENDED_RELEASE_TABLET | Freq: Three times a day (TID) | ORAL | Status: AC
  Administered 2016-06-28 – 2016-06-29 (×3): 30 meq via ORAL
  Filled 2016-06-28 (×3): qty 1

## 2016-06-28 NOTE — Progress Notes (Signed)
Onfi 5 mg wasted with Aquilla HackerSusan Walton RN.  Patient received 15mg  as ordered
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
## 2016-06-28 NOTE — Progress Notes (Signed)
  Onfi 5 mg wasted with Corene Corneaawn Williams-James RN.  Patient received 15mg  as ordered

## 2016-06-28 NOTE — Progress Notes (Signed)
PROGRESS NOTE    Nathan Moore  WJX:[id number]  DOB: [birth year]  DOA: 06/27/2016 PCP: Jackie Plum, MD Outpatient Specialists: Dr. Sharene Skeans neurology   [facility] course: HPI: Nathan Moore is a 20 y.o. male with medical history significant of seizures and autism with behavioral disturbances.  History obtained from chart and caregiver at bedside as patient is non verbal at baseline. Patient does walk and feed self normally.   Patient has been having seizures more frequently in last few months.  After his seizures, patient tends to  Rub fingers together for at least 1 hour.  Caregiver has noticed him doing this more than usual.  He has also seen patient chewing on fingers.  Patient was seen on 9/8 in the ER and noticed to have abrasions to fingers at that time.  He was given a tenatuns shot and wound care.  He comes back to the ER with fever and worsening infection.    ED Course: In the ER, he was given tylenol for fever.  ER doc did small I/D of area with purulent material and this was sent for culture.  Ortho was consulted and patient was made NPO for now.  Given vanc/zosyn.    Assessment & Plan:   Autism spectrum disorder with accompanying language impairment, requiring very substantial support (level 3)   Partial epilepsy with impairment of consciousness (HCC)   Cellulitis and abscess of hand   Cellulitis of hands (no osteo on x ray) -from patient chewing as well as picking at fingers -continue vanc/zosyn IV per ortho -small I/D at bedside by ER doc-- sent for culture -Dr. Rayburn Ma following  Uncontrolled seizures -follows with Dr. Sharene Skeans, continue seizure precautions -no recent medication changes  Autism with behavioral disturbances -chews on fingers as well as rubs fingers together -may need psych consult vs behavior modifications-- gloves (has one now that just covers thumb but leaves finger free and patient chews through glove)   DVT prophylaxis:  lovenox Code Status: full Family Communication: caregiver from group home at bedside Disposition Plan: ? If group home can handle Consults called: ortho general-- no hand on call Admission status: inpt  Antimicrobials: Anti-infectives    Start     Dose/Rate Route Frequency Ordered Stop   06/28/16 0000  vancomycin (VANCOCIN) 1,500 mg in sodium chloride 0.9 % 500 mL IVPB     1,500 mg 250 mL/hr over 120 Minutes Intravenous Every 12 hours 06/27/16 1206     06/27/16 1800  piperacillin-tazobactam (ZOSYN) IVPB 3.375 g     3.375 g 12.5 mL/hr over 240 Minutes Intravenous Every 8 hours 06/27/16 1205     06/27/16 1215  piperacillin-tazobactam (ZOSYN) IVPB 3.375 g     3.375 g 100 mL/hr over 30 Minutes Intravenous  Once 06/27/16 1204 06/27/16 1244   06/27/16 1215  vancomycin (VANCOCIN) 1,500 mg in sodium chloride 0.9 % 500 mL IVPB     1,500 mg 250 mL/hr over 120 Minutes Intravenous  Once 06/27/16 1206 06/27/16 1431        Subjective: Pt nonverbal  Objective: Vitals:   06/27/16 1430 06/27/16 1516 06/27/16 2117 06/28/16 0554  BP: 108/57 (!) 93/47 126/71 118/60  Pulse: 112 (!) 109 (!) 116 (!) 107  Resp: (!) 27 (!) 25 20 (!) 24  Temp:  98.8 F (37.1 C) 99.2 F (37.3 C) 98.9 F (37.2 C)  TempSrc:  Axillary Axillary Axillary  SpO2: 97% 97% 99% 98%  Weight:      Height:        Intake/Output  Summary (Last 24 hours) at 06/28/16 1305 Last data filed at 06/28/16 1025  Gross per 24 hour  Intake             1830 ml  Output             2125 ml  Net             -295 ml   Filed Weights   06/27/16 1132  Weight: 81.6 kg (180 lb)    Exam:  Eyes: PERRL, lids and conjunctivae normal Neck: normal, supple, no masses, no thyromegaly Respiratory: clear to auscultation bilaterally, no wheezing, no crackles. Normal respiratory effort. No accessory muscle use.  Cardiovascular: tachy, no murmurs / rubs / gallops. No extremity edema. 2+ pedal pulses. No carotid bruits.  Abdomen: no tenderness,  no masses palpated. No hepatosplenomegaly. Bowel sounds positive.  Musculoskeletal: no clubbing / cyanosis. No joint deformity upper and lower extremities. Good ROM, no contractures. Normal muscle tone.  Skin: acne on face-- hand clenched with large areas of redness extending from finger to wrist area-- see ER pics Neurologic: not following commands Psychiatric: Non-verbal at baseline  _       Data Reviewed: Basic Metabolic Panel:  Recent Labs Lab 06/27/16 1158 06/28/16 0438  NA 137 139  K 3.3* 3.2*  CL 104 108  CO2 25 24  GLUCOSE 96 96  BUN 9 6  CREATININE 0.73 0.70  CALCIUM 8.6* 8.2*   Liver Function Tests:  Recent Labs Lab 06/27/16 1158  AST 16  ALT 18  ALKPHOS 65  BILITOT 1.2  PROT 6.6  ALBUMIN 3.8   No results for input(s): LIPASE, AMYLASE in the last 168 hours. No results for input(s): AMMONIA in the last 168 hours. CBC:  Recent Labs Lab 06/27/16 1158 06/28/16 0438  WBC 11.8* 10.9*  NEUTROABS 9.0*  --   HGB 14.0 12.8*  HCT 40.1 37.2*  MCV 83.9 84.7  PLT 146* 142*   Cardiac Enzymes: No results for input(s): CKTOTAL, CKMB, CKMBINDEX, TROPONINI in the last 168 hours. CBG (last 3)  No results for input(s): GLUCAP in the last 72 hours. Recent Results (from the past 240 hour(s))  Urine culture     Status: None   Collection Time: 06/27/16 11:36 AM  Result Value Ref Range Status   Specimen Description URINE, RANDOM  Final   Special Requests NONE  Final   Culture NO GROWTH [facility]   Final   Report Status 06/28/2016 FINAL  Final  Wound or Superficial Culture     Status: None (Preliminary result)   Collection Time: 06/27/16 12:31 PM  Result Value Ref Range Status   Specimen Description WOUND RIGHT FINGER  Final   Special Requests NONE  Final   Gram Stain   Final    NO WBC SEEN FEW GRAM POSITIVE COCCI IN PAIRS IN SINGLES RARE GRAM POSITIVE RODS [facility]    Culture MODERATE STAPHYLOCOCCUS AUREUS   Final   Report Status PENDING  Incomplete     Studies: Dg Chest Portable 1 View  Result Date: 06/27/2016 CLINICAL DATA:  Sepsis.  Autism. EXAM: PORTABLE CHEST 1 VIEW COMPARISON:  None. FINDINGS: The heart size and mediastinal contours are within normal limits. Both lungs are clear. The visualized skeletal structures are unremarkable. IMPRESSION: No active disease. Electronically Signed   By: Myles RosenthalJohn  Stahl M.D.   On: 06/27/2016 12:31   Dg Hand Complete Right  Result Date: 06/27/2016 CLINICAL DATA:  Swelling,  erythema, and whose in wounds of the right index and middle fingers. The patient has been scratching this region. EXAM: RIGHT HAND - COMPLETE 3+ VIEW COMPARISON:  None in PACs FINDINGS: There is marked soft tissue swelling of the index finger with milder swelling of the middle finger. No foreign bodies or gas collections are observed. No underlying bony changes are demonstrated. Elsewhere the bones of the right hand are normal. IMPRESSION: Findings compatible with cellulitis of the index and third fingers. No evidence of osteomyelitis. Electronically Signed   By: David  [place] M.D.   On: 06/27/2016 12:33   Scheduled Meds: . chlorproMAZINE  100 mg Oral q morning - 10a  . chlorproMAZINE  200 mg Oral QHS  . cloBAZam  15 mg Oral BID  . enoxaparin (LOVENOX) injection  40 mg Subcutaneous Q24H  . levETIRAcetam  1,000 mg Oral BID  . lidocaine  20 mL Intradermal Once  . loratadine  10 mg Oral Daily  . piperacillin-tazobactam (ZOSYN)  IV  3.375 g Intravenous Q8H  . propranolol  80 mg Oral TID  . vancomycin  1,500 mg Intravenous Q12H   Continuous Infusions:   Active Problems:   Behavior disturbance   Autism   Autism spectrum disorder with accompanying language impairment, requiring very substantial support (level 3)   Partial epilepsy with impairment of consciousness (HCC)   Cellulitis and abscess of hand  Time spent:   Standley Dakins, MD, FAAFP [facility] Pager [phone]  [phone]  If 7PM-7AM, please contact night-coverage [email or web address] Password [email or web address] 06/28/2016, 1:05 PM    LOS: 1 day

## 2016-06-29 LAB — AEROBIC CULTURE W GRAM STAIN (SUPERFICIAL SPECIMEN): Gram Stain: NONE SEEN

## 2016-06-29 LAB — CBC
HEMATOCRIT: 38.7 % — AB (ref 39.0–52.0)
HEMOGLOBIN: 13.6 g/dL (ref 13.0–17.0)
MCH: 29.2 pg (ref 26.0–34.0)
MCHC: 35.1 g/dL (ref 30.0–36.0)
MCV: 83 fL (ref 78.0–100.0)
Platelets: 146 10*3/uL — ABNORMAL LOW (ref 150–400)
RBC: 4.66 MIL/uL (ref 4.22–5.81)
RDW: 12.4 % (ref 11.5–15.5)
WBC: 9.6 10*3/uL (ref 4.0–10.5)

## 2016-06-29 LAB — AEROBIC CULTURE  (SUPERFICIAL SPECIMEN)

## 2016-06-29 LAB — BASIC METABOLIC PANEL
Anion gap: 7 (ref 5–15)
BUN: <5 mg/dL — ABNORMAL LOW (ref 6–20)
CALCIUM: 9.1 mg/dL (ref 8.9–10.3)
CHLORIDE: 106 mmol/L (ref 101–111)
CO2: 25 mmol/L (ref 22–32)
CREATININE: 0.76 mg/dL (ref 0.61–1.24)
GFR calc non Af Amer: >60 mL/min (ref >60)
GLUCOSE: 94 mg/dL (ref 65–99)
Potassium: 4.1 mmol/L (ref 3.5–5.1)
Sodium: 138 mmol/L (ref 135–145)

## 2016-06-29 MED ORDER — CEFAZOLIN IN D5W 1 GM/50ML IV SOLN
1.0000 g | Freq: Three times a day (TID) | INTRAVENOUS | Status: AC
  Administered 2016-06-29 – 2016-07-01 (×7): 1 g via INTRAVENOUS
  Filled 2016-06-29 (×8): qty 50

## 2016-06-29 NOTE — Progress Notes (Signed)
PROGRESS NOTE    Nathan Moore  ZOX:[id number]  DOB: [birth year]  DOA: 06/27/2016 PCP: Jackie Plum, MD Outpatient Specialists: Dr. Sharene Skeans neurology   [facility] course: HPI: Nathan Moore is a 20 y.o. male with medical history significant of seizures and autism with behavioral disturbances.  History obtained from chart and caregiver at bedside as patient is non verbal at baseline. Patient does walk and feed self normally.   Patient has been having seizures more frequently in last few months.  After his seizures, patient tends to  Rub fingers together for at least 1 hour.  Caregiver has noticed him doing this more than usual.  He has also seen patient chewing on fingers.  Patient was seen on 9/8 in the ER and noticed to have abrasions to fingers at that time.  He was given a tenatuns shot and wound care.  He comes back to the ER with fever and worsening infection.    ED Course: In the ER, he was given tylenol for fever.  ER doc did small I/D of area with purulent material and this was sent for culture.  Ortho was consulted and patient was made NPO for now.  Given vanc/zosyn. Transitioned to cefazolin IV 9/15.     Assessment & Plan:   Autism spectrum disorder with accompanying language impairment, requiring very substantial support (level 3)   Partial epilepsy with impairment of consciousness (HCC)   Cellulitis and abscess of hand   Cellulitis of hands (no osteo on x ray) -from patient chewing as well as picking at fingers -small I/D at bedside by ER doc-- sent for culturecultures growing pansensitive MSSA and proteus - ordered cefazolin 1 gm IV Q8h -Dr. Rayburn Ma following  Uncontrolled seizures -follows with Dr. Sharene Skeans, continue seizure precautions -no recent medication changes  Autism with behavioral disturbances -chews on fingers as well as rubs fingers together -may need psych consult vs behavior modifications-- gloves (has one now that just covers thumb but leaves  finger free and patient chews through glove)  DVT prophylaxis: lovenox Code Status: full Family Communication: caregiver from group home at bedside Disposition Plan: ? If group home can handle Consults called: ortho general-- no hand on call Admission status: inpt  Antimicrobials: Anti-infectives    Start     Dose/Rate Route Frequency Ordered Stop   06/29/16 1200  ceFAZolin (ANCEF) IVPB 1 g/50 mL premix     1 g 100 mL/hr over 30 Minutes Intravenous Every 8 hours 06/29/16 1027     06/28/16 0000  vancomycin (VANCOCIN) 1,500 mg in sodium chloride 0.9 % 500 mL IVPB  Status:  Discontinued     1,500 mg 250 mL/hr over 120 Minutes Intravenous Every 12 hours 06/27/16 1206 06/29/16 1027   06/27/16 1800  piperacillin-tazobactam (ZOSYN) IVPB 3.375 g  Status:  Discontinued     3.375 g 12.5 mL/hr over 240 Minutes Intravenous Every 8 hours 06/27/16 1205 06/29/16 1027   06/27/16 1215  piperacillin-tazobactam (ZOSYN) IVPB 3.375 g     3.375 g 100 mL/hr over 30 Minutes Intravenous  Once 06/27/16 1204 06/27/16 1244   06/27/16 1215  vancomycin (VANCOCIN) 1,500 mg in sodium chloride 0.9 % 500 mL IVPB     1,500 mg 250 mL/hr over 120 Minutes Intravenous  Once 06/27/16 1206 06/27/16 1431       Subjective: Pt nonverbal  Objective: Vitals:   06/28/16 1424 06/28/16 2056 06/29/16 0447 06/29/16 1048  BP: 127/77 125/60 129/72 134/80  Pulse: (!) 103 87 70 82  Resp: 20 20 20  Temp: 99.2 F (37.3 C) 98.8 F (37.1 C) 98.2 F (36.8 C)   TempSrc: Axillary Axillary Axillary   SpO2: 99% 99% 100%   Weight:      Height:        Intake/Output Summary (Last 24 hours) at 06/29/16 1300 Last data filed at 06/29/16 1140  Gross per 24 hour  Intake             1480 ml  Output             3250 ml  Net            -1770 ml   Filed Weights   06/27/16 1132  Weight: 81.6 kg (180 lb)    Exam:  Eyes: PERRL, lids and conjunctivae normal Neck: normal, supple, no masses, no thyromegaly Respiratory: clear to  auscultation bilaterally, no wheezing, no crackles. Normal respiratory effort. No accessory muscle use.  Cardiovascular: tachy, no murmurs / rubs / gallops. No extremity edema. 2+ pedal pulses. No carotid bruits.  Abdomen: no tenderness, no masses palpated. No hepatosplenomegaly. Bowel sounds positive.  Musculoskeletal: no clubbing / cyanosis. No joint deformity upper and lower extremities. Good ROM, no contractures. Normal muscle tone.  Skin: acne on face-- hand clenched with large areas of redness extending from finger to wrist area-- see ER pics Neurologic: not following commands Psychiatric: Non-verbal at baseline     Data Reviewed: Basic Metabolic Panel:  Recent Labs Lab 06/27/16 1158 06/28/16 0438 06/29/16 0439  NA 137 139 138  K 3.3* 3.2* 4.1  CL 104 108 106  CO2 25 24 25   GLUCOSE 96 96 94  BUN 9 6 <5*  CREATININE 0.73 0.70 0.76  CALCIUM 8.6* 8.2* 9.1   Liver Function Tests:  Recent Labs Lab 06/27/16 1158  AST 16  ALT 18  ALKPHOS 65  BILITOT 1.2  PROT 6.6  ALBUMIN 3.8   No results for input(s): LIPASE, AMYLASE in the last 168 hours. No results for input(s): AMMONIA in the last 168 hours. CBC:  Recent Labs Lab 06/27/16 1158 06/28/16 0438 06/29/16 0439  WBC 11.8* 10.9* 9.6  NEUTROABS 9.0*  --   --   HGB 14.0 12.8* 13.6  HCT 40.1 37.2* 38.7*  MCV 83.9 84.7 83.0  PLT 146* 142* 146*   Cardiac Enzymes: No results for input(s): CKTOTAL, CKMB, CKMBINDEX, TROPONINI in the last 168 hours. CBG (last 3)  No results for input(s): GLUCAP in the last 72 hours. Recent Results (from the past 240 hour(s))  Urine culture     Status: None   Collection Time: 06/27/16 11:36 AM  Result Value Ref Range Status   Specimen Description URINE, RANDOM  Final   Special Requests NONE  Final   Culture NO GROWTH [facility]   Final   Report Status 06/28/2016 FINAL  Final  Culture, blood (Routine x 2)     Status: None (Preliminary result)   Collection  Time: 06/27/16 11:55 AM  Result Value Ref Range Status   Specimen Description BLOOD RIGHT ANTECUBITAL  Final   Special Requests BOTTLES DRAWN AEROBIC AND ANAEROBIC 5ML  Final   Culture   Final    NO GROWTH 1 DAY [facility]formed [facility]    Report Status PENDING  Incomplete  Culture, blood (Routine x 2)     Status: None (Preliminary result)   Collection Time: 06/27/16 11:58 AM  Result Value Ref Range Status   Specimen Description BLOOD LEFT ANTECUBITAL  Final  Special Requests BOTTLES DRAWN AEROBIC AND ANAEROBIC  Final   Culture   Final    NO GROWTH 1 DAY Performed at [facility]    Report Status PENDING  Incomplete  Wound or Superficial Culture     Status: None   Collection Time: 06/27/16 12:31 PM  Result Value Ref Range Status   Specimen Description WOUND RIGHT FINGER  Final   Special Requests NONE  Final   Gram Stain   Final    NO WBC SEEN FEW GRAM POSITIVE COCCI IN PAIRS IN SINGLES RARE GRAM POSITIVE RODS Performed at [facility]    Culture   Final    MODERATE STAPHYLOCOCCUS AUREUS MODERATE PROTEUS MIRABILIS    Report Status 06/29/2016 FINAL  Final   Organism ID, Bacteria STAPHYLOCOCCUS AUREUS  Final   Organism ID, Bacteria PROTEUS MIRABILIS  Final      Susceptibility   Proteus mirabilis - MIC*    AMPICILLIN <=2 SENSITIVE Sensitive     CEFAZOLIN <=4 SENSITIVE Sensitive     CEFEPIME <=1 SENSITIVE Sensitive     CEFTAZIDIME <=1 SENSITIVE Sensitive     CEFTRIAXONE <=1 SENSITIVE Sensitive     CIPROFLOXACIN <=0.25 SENSITIVE Sensitive     GENTAMICIN <=1 SENSITIVE Sensitive     IMIPENEM 2 SENSITIVE Sensitive     TRIMETH/SULFA <=20 SENSITIVE Sensitive     AMPICILLIN/SULBACTAM <=2 SENSITIVE Sensitive     PIP/TAZO <=4 SENSITIVE Sensitive     * MODERATE PROTEUS MIRABILIS   Staphylococcus aureus - MIC*    CIPROFLOXACIN <=0.5 SENSITIVE Sensitive     ERYTHROMYCIN >=8 RESISTANT Resistant     GENTAMICIN <=0.5 SENSITIVE Sensitive     OXACILLIN 0.5  SENSITIVE Sensitive     TETRACYCLINE >=16 RESISTANT Resistant     VANCOMYCIN <=0.5 SENSITIVE Sensitive     TRIMETH/SULFA <=10 SENSITIVE Sensitive     CLINDAMYCIN >=8 RESISTANT Resistant     RIFAMPIN <=0.5 SENSITIVE Sensitive     Inducible Clindamycin NEGATIVE Sensitive     * MODERATE STAPHYLOCOCCUS AUREUS     Studies: No results found. Scheduled Meds: .  ceFAZolin (ANCEF) IV  1 g Intravenous Q8H  . chlorproMAZINE  100 mg Oral q morning - 10a  . chlorproMAZINE  200 mg Oral QHS  . cloBAZam  15 mg Oral BID  . enoxaparin (LOVENOX) injection  40 mg Subcutaneous Q24H  . levETIRAcetam  1,000 mg Oral BID  . lidocaine  20 mL Intradermal Once  . loratadine  10 mg Oral Daily  . propranolol  80 mg Oral TID   Continuous Infusions:   Active Problems:   Behavior disturbance   Autism   Autism spectrum disorder with accompanying language impairment, requiring very substantial support (level 3)   Partial epilepsy with impairment of consciousness (HCC)   Cellulitis and abscess of hand  Time spent:   Standley Dakins, MD, FAAFP [facility] Pager [phone]  If 7PM-7AM, please contact night-coverage [email or web address] Password [email or web address] 06/29/2016, 1:00 PM    LOS: 2 days

## 2016-06-29 NOTE — Progress Notes (Signed)
Pharmacy Antibiotic Note  Nathan Moore is a 20 y.o. male admitted on 06/27/2016 with finger wound infections.  Pharmacy has been consulted for Vancomycin and Zosyn dosing.  Today is D#3 antibiotics  Today, 06/29/2016:  Renal: Scr stable  CBC: WBC WNL  No fevers  Wound culture updated to reveal proteus and MSSA  Plan:  Vancomycin and zosyn doses remain appropriate, based on wound culture results, suggest change to cefazolin 1gm IV q8h (use augmentin or cephalexin at discharge)  Vancomycin 1500 mg IV every 12 hours.  Goal trough 10-15 mcg/mL.  Zosyn 3.375g IV q8h (4 hour infusion).  F/u SCr, VT, clinical course.  Height: 6' (182.9 cm) Weight: 180 lb (81.6 kg) IBW/kg (Calculated) : 77.6  Temp (24hrs), Avg:98.7 F (37.1 C), Min:98.2 F (36.8 C), Max:99.2 F (37.3 C)   Recent Labs Lab 06/27/16 1158 06/27/16 1211 06/28/16 0438 06/29/16 0439  WBC 11.8*  --  10.9* 9.6  CREATININE 0.73  --  0.70 0.76  LATICACIDVEN  --  0.94  --   --     Estimated Creatinine Clearance: 163 mL/min (by C-G formula based on SCr of 0.76 mg/dL).    No Known Allergies  Antimicrobials this admission:  9/13 Zosyn >>  9/13 Vancomycin >>   Dose adjustments this admission:  -  Microbiology results:  9/13 BCx: NGTD 9/13 UCx: NGTD 9/13 R finger wound: MSSA (R to clinda, tetracycline, emycin), P. Mirabilis (pan-sens)   Thank you for allowing pharmacy to be a part of this patient's care.  Juliette Alcideustin Zeigler, PharmD, BCPS.   Pager: [phone] 06/29/2016 9:56 AM

## 2016-06-29 NOTE — Progress Notes (Signed)
Patient ID: Nathan Moore, male   DOB: [birth year], 20 y.o.   MRN: [id number] His left thumb and right index finger dressings were changed at the bedside.  The swelling and redness in the right hand has decreased greatly and the red streaks have resolved.  The index finger itself appears dusky, but is viable.  No surgery is needed thus far, but wound care daily will be crucial for healing as well as trying to keep dressings on his left thumb and right index fingers to he keeps them away from his mouth.

## 2016-06-30 NOTE — Progress Notes (Signed)
PROGRESS NOTE    Nathan Moore  ZOX:[id number]  DOB: 19-Jul-1996  DOA: 06/27/2016 PCP: Jackie Plum, MD Outpatient Specialists: Dr. Sharene Skeans neurology   [facility] course: HPI: Nathan Moore is a 20 y.o. male with medical history significant of seizures and autism with behavioral disturbances.  History obtained from chart and caregiver at bedside as patient is non verbal at baseline. Patient does walk and feed self normally.   Patient has been having seizures more frequently in last few months.  After his seizures, patient tends to  Rub fingers together for at least 1 hour.  Caregiver has noticed him doing this more than usual.  He has also seen patient chewing on fingers.  Patient was seen on 9/8 in the ER and noticed to have abrasions to fingers at that time.  He was given a tenatuns shot and wound care.  He comes back to the ER with fever and worsening infection.    ED Course: In the ER, he was given tylenol for fever.  ER doc did small I/D of area with purulent material and this was sent for culture.  Ortho was consulted and patient was made NPO for now.  Given vanc/zosyn. Transitioned to cefazolin IV 9/15.     Assessment & Plan:   Autism spectrum disorder with accompanying language impairment, requiring very substantial support (level 3)   Partial epilepsy with impairment of consciousness (HCC)   Cellulitis and abscess of hand   Cellulitis of hands (no osteo on x ray) - clinically improving, WBC trending down, still with edema and heat and redness (less than on admission) -from patient chewing as well as picking at fingers -small I/D at bedside by ER doc--cultures growing pansensitive MSSA and proteus - ordered cefazolin 1 gm IV Q8h, Plan to continue IV antibiotics thru today and consider transitioning him to oral antibiotics in next 1-2 days.  -Dr. Rayburn Ma following  Uncontrolled seizures -follows with Dr. Sharene Skeans, continue seizure precautions -no recent medication  changes -no seizure activity so far observed in hospital   Autism with behavioral disturbances -chews on fingers as well as rubs fingers together -may need psych consult vs behavior modifications-- gloves (has one now that just covers thumb but leaves finger free and patient chews through glove)  DVT prophylaxis: lovenox Code Status: full Family Communication: caregiver from group home at bedside Disposition Plan:  Consults called: ortho general-- no hand on call Admission status: inpt  Antimicrobials: Anti-infectives    Start     Dose/Rate Route Frequency Ordered Stop   06/29/16 1200  ceFAZolin (ANCEF) IVPB 1 g/50 mL premix     1 g 100 mL/hr over 30 Minutes Intravenous Every 8 hours 06/29/16 1027     06/28/16 0000  vancomycin (VANCOCIN) 1,500 mg in sodium chloride 0.9 % 500 mL IVPB  Status:  Discontinued     1,500 mg 250 mL/hr over 120 Minutes Intravenous Every 12 hours 06/27/16 1206 06/29/16 1027   06/27/16 1800  piperacillin-tazobactam (ZOSYN) IVPB 3.375 g  Status:  Discontinued     3.375 g 12.5 mL/hr over 240 Minutes Intravenous Every 8 hours 06/27/16 1205 06/29/16 1027   06/27/16 1215  piperacillin-tazobactam (ZOSYN) IVPB 3.375 g     3.375 g 100 mL/hr over 30 Minutes Intravenous  Once 06/27/16 1204 06/27/16 1244   06/27/16 1215  vancomycin (VANCOCIN) 1,500 mg in sodium chloride 0.9 % 500 mL IVPB     1,500 mg 250 mL/hr over 120 Minutes Intravenous  Once 06/27/16 1206 06/27/16 1431  Subjective: Pt nonverbal   Objective: Vitals:   06/29/16 2028 06/30/16 0649 06/30/16 1029 06/30/16 1316  BP: 115/64 132/79 130/66 121/69  Pulse: 85 87 78 81  Resp: 20 16  18   Temp: 98.2 F (36.8 C) 97.7 F (36.5 C)  98.3 F (36.8 C)  TempSrc: Oral Oral  Oral  SpO2: 97% 97%  98%  Weight:      Height:        Intake/Output Summary (Last 24 hours) at 06/30/16 1418 Last data filed at 06/30/16 1310  Gross per 24 hour  Intake             1300 ml  Output             1950 ml  Net              -650 ml   Filed Weights   06/27/16 1132  Weight: 81.6 kg (180 lb)    Exam:  Gen: Pt nonverbal but no apparent distress. Cooperative.   Eyes: PERRL, lids and conjunctivae normal Neck: normal, supple, no masses, no thyromegaly Respiratory: clear to auscultation bilaterally, no wheezing, no crackles. Normal respiratory effort. No accessory muscle use.  Cardiovascular: tachy, no murmurs / rubs / gallops. No extremity edema. 2+ pedal pulses. No carotid bruits.  Abdomen: no tenderness, no masses palpated. No hepatosplenomegaly. Bowel sounds positive.  Musculoskeletal: no clubbing / cyanosis. No joint deformity upper and lower extremities. Good ROM, no contractures. Normal muscle tone.  Skin: acne on face--right hand and finger cellulitis much improved, less redness, still with edema and heat but overall improved. Neurologic: No focal abnormalities.  Psychiatric: Non-verbal at baseline  Data Reviewed: Basic Metabolic Panel:  Recent Labs Lab 06/27/16 1158 06/28/16 0438 06/29/16 0439  NA 137 139 138  K 3.3* 3.2* 4.1  CL 104 108 106  CO2 25 24 25   GLUCOSE 96 96 94  BUN 9 6 <5*  CREATININE 0.73 0.70 0.76  CALCIUM 8.6* 8.2* 9.1   Liver Function Tests:  Recent Labs Lab 06/27/16 1158  AST 16  ALT 18  ALKPHOS 65  BILITOT 1.2  PROT 6.6  ALBUMIN 3.8   No results for input(s): LIPASE, AMYLASE in the last 168 hours. No results for input(s): AMMONIA in the last 168 hours. CBC:  Recent Labs Lab 06/27/16 1158 06/28/16 0438 06/29/16 0439  WBC 11.8* 10.9* 9.6  NEUTROABS 9.0*  --   --   HGB 14.0 12.8* 13.6  HCT 40.1 37.2* 38.7*  MCV 83.9 84.7 83.0  PLT 146* 142* 146*   Cardiac Enzymes: No results for input(s): CKTOTAL, CKMB, CKMBINDEX, TROPONINI in the last 168 hours. CBG (last 3)  No results for input(s): GLUCAP in the last 72 hours. Recent Results (from the past 240 hour(s))  Urine culture     Status: None   Collection Time: 06/27/16 11:36 AM  Result Value  Ref Range Status   Specimen Description URINE, RANDOM  Final   Special Requests NONE  Final   Culture NO GROWTH Performed at [facility]   Final   Report Status 06/28/2016 FINAL  Final  Culture, blood (Routine x 2)     Status: None (Preliminary result)   Collection Time: 06/27/16 11:55 AM  Result Value Ref Range Status   Specimen Description BLOOD RIGHT ANTECUBITAL  Final   Special Requests BOTTLES DRAWN AEROBIC AND ANAEROBIC  Final   Culture   Final    NO GROWTH 3 DAYS Performed at [facility]  Report Status PENDING  Incomplete  Culture, blood (Routine x 2)     Status: None (Preliminary result)   Collection Time: 06/27/16 11:58 AM  Result Value Ref Range Status   Specimen Description BLOOD LEFT ANTECUBITAL  Final   Special Requests BOTTLES DRAWN AEROBIC AND ANAEROBIC 5ML  Final   Culture   Final    NO GROWTH 3 DAYS Performed at [facility]    Report Status PENDING  Incomplete  Wound or Superficial Culture     Status: None   Collection Time: 06/27/16 12:31 PM  Result Value Ref Range Status   Specimen Description WOUND RIGHT FINGER  Final   Special Requests NONE  Final   Gram Stain   Final    NO WBC SEEN FEW GRAM POSITIVE COCCI IN PAIRS IN SINGLES RARE GRAM POSITIVE RODS [facility]formed at [facility]    Culture   Final    MODERATE STAPHYLOCOCCUS AUREUS MODERATE PROTEUS MIRABILIS    Report Status 06/29/2016 FINAL  Final   Organism ID, Bacteria STAPHYLOCOCCUS AUREUS  Final   Organism ID, Bacteria PROTEUS MIRABILIS  Final      Susceptibility   Proteus mirabilis - MIC*    AMPICILLIN <=2 SENSITIVE Sensitive     CEFAZOLIN <=4 SENSITIVE Sensitive     CEFEPIME <=1 SENSITIVE Sensitive     CEFTAZIDIME <=1 SENSITIVE Sensitive     CEFTRIAXONE <=1 SENSITIVE Sensitive     CIPROFLOXACIN <=0.25 SENSITIVE Sensitive     GENTAMICIN <=1 SENSITIVE Sensitive     IMIPENEM 2 SENSITIVE Sensitive     TRIMETH/SULFA <=20 SENSITIVE Sensitive      AMPICILLIN/SULBACTAM <=2 SENSITIVE Sensitive     PIP/TAZO <=4 SENSITIVE Sensitive     * MODERATE PROTEUS MIRABILIS   Staphylococcus aureus - MIC*    CIPROFLOXACIN <=0.5 SENSITIVE Sensitive     ERYTHROMYCIN >=8 RESISTANT Resistant     GENTAMICIN <=0.5 SENSITIVE Sensitive     OXACILLIN 0.5 SENSITIVE Sensitive     TETRACYCLINE >=16 RESISTANT Resistant     VANCOMYCIN <=0.5 SENSITIVE Sensitive     TRIMETH/SULFA <=10 SENSITIVE Sensitive     CLINDAMYCIN >=8 RESISTANT Resistant     RIFAMPIN <=0.5 SENSITIVE Sensitive     Inducible Clindamycin NEGATIVE Sensitive     * MODERATE STAPHYLOCOCCUS AUREUS    Studies: No results found. Scheduled Meds: .  ceFAZolin (ANCEF) IV  1 g Intravenous Q8H  . chlorproMAZINE  100 mg Oral q morning - 10a  . chlorproMAZINE  200 mg Oral QHS  . cloBAZam  15 mg Oral BID  . enoxaparin (LOVENOX) injection  40 mg Subcutaneous Q24H  . levETIRAcetam  1,000 mg Oral BID  . lidocaine  20 mL Intradermal Once  . loratadine  10 mg Oral Daily  . propranolol  80 mg Oral TID   Continuous Infusions:   Active Problems:   Behavior disturbance   Autism   Autism spectrum disorder with accompanying language impairment, requiring very substantial support (level 3)   Partial epilepsy with impairment of consciousness (HCC)   Cellulitis and abscess of hand  Time spent:   Standley Dakinslanford Jayliana Valencia, MD, FAAFP [facility] Pager [phone]  If 7PM-7AM, please contact night-coverage [email or web address] Password [email or web address] 06/30/2016, 2:18 PM    LOS: 3 days

## 2016-07-01 DIAGNOSIS — R197 Diarrhea, unspecified: Secondary | ICD-10-CM

## 2016-07-01 MED ORDER — SACCHAROMYCES BOULARDII 250 MG PO CAPS
250.0000 mg | ORAL_CAPSULE | Freq: Two times a day (BID) | ORAL | Status: DC
  Administered 2016-07-01 – 2016-07-05 (×9): 250 mg via ORAL
  Filled 2016-07-01 (×9): qty 1

## 2016-07-01 MED ORDER — LOPERAMIDE HCL 2 MG PO CAPS
2.0000 mg | ORAL_CAPSULE | Freq: Every day | ORAL | Status: DC | PRN
  Administered 2016-07-01: 2 mg via ORAL
  Filled 2016-07-01: qty 1

## 2016-07-01 MED ORDER — CEPHALEXIN 500 MG PO CAPS
500.0000 mg | ORAL_CAPSULE | Freq: Three times a day (TID) | ORAL | Status: DC
  Administered 2016-07-01 – 2016-07-04 (×8): 500 mg via ORAL
  Filled 2016-07-01 (×10): qty 1

## 2016-07-01 NOTE — Progress Notes (Signed)
07/01/2016 7:00 PM  Came to assess patient as he has been more agitated trying to get out of bed, unsafe due to his unsteady gait, grabbing at his thumb much more, had to be placed in soft nonviolent belt restraint. Also will dc enteric precautions as he is not having diarrhea any longer and stools have been formed today.  Thorazine ordered prn as needed for agitation.     Maryln Manuel. Ciclaly Mulcahey, MD

## 2016-07-01 NOTE — Progress Notes (Signed)
Dr. Laural BenesJohnson in to see pt. See new orders MD entered into EPIC.

## 2016-07-01 NOTE — Plan of Care (Signed)
Problem: Skin Integrity: Goal: Risk for impaired skin integrity will decrease Outcome: Progressing Dressings to hands changed at 1400 per order.

## 2016-07-01 NOTE — Progress Notes (Signed)
PROGRESS NOTE    Nathan Moore  DEY:[id number]  DOB: Aug 01, 1996  DOA: 06/27/2016 PCP: Jackie Plum, MD Outpatient Specialists: Dr. Sharene Skeans neurology   [facility] course: HPI: Nathan Moore is a 20 y.o. male with medical history significant of seizures and autism with behavioral disturbances.  History obtained from chart and caregiver at bedside as patient is non verbal at baseline. Patient does walk and feed self normally.   Patient has been having seizures more frequently in last few months.  After his seizures, patient tends to  Rub fingers together for at least 1 hour.  Caregiver has noticed him doing this more than usual.  He has also seen patient chewing on fingers.  Patient was seen on 9/8 in the ER and noticed to have abrasions to fingers at that time.  He was given a tenatuns shot and wound care.  He comes back to the ER with fever and worsening infection.    ED Course: In the ER, he was given tylenol for fever.  ER doc did small I/D of area with purulent material and this was sent for culture.  Ortho was consulted and patient was made NPO for now.  Given vanc/zosyn. Transitioned to cefazolin IV 9/15.     Assessment & Plan:   Autism spectrum disorder with accompanying language impairment, requiring very substantial support (level 3)   Partial epilepsy with impairment of consciousness (HCC)   Cellulitis and abscess of hand   Cellulitis of hands (no osteo on x ray) - clinically improving, WBC trending down, still with edema and heat and redness (less than on admission) -from patient chewing as well as picking at fingers -small I/D at bedside by ER doc--cultures growing pansensitive MSSA and proteus - ordered cefazolin 1 gm IV Q8h, Plan to DC IV antibiotics and transitioning him to oral cephalexin.  -Dr. Rayburn Ma following, continue local wound care  Acute Diarrhea - suspect this is secondary to the antibiotics, improved with lomotil, stool studies ordered. Probiotics  started.   Uncontrolled seizures -follows with Dr. Sharene Skeans, continue seizure precautions -no recent medication changes -no seizure activity so far observed in hospital   Autism with behavioral disturbances -chews on fingers as well as rubs fingers together -may need psych consult vs behavior modifications-- gloves (has one now that just covers thumb but leaves finger free and patient chews through glove)  DVT prophylaxis: lovenox Code Status: full Family Communication: caregiver from group home at bedside Disposition Plan: TBD Consults called: ortho general-- no hand on call Admission status: inpt  Antimicrobials: Anti-infectives    Start     Dose/Rate Route Frequency Ordered Stop   07/01/16 2200  cephALEXin (KEFLEX) capsule 500 mg     500 mg Oral Every 8 hours 07/01/16 1037     06/29/16 1200  ceFAZolin (ANCEF) IVPB 1 g/50 mL premix     1 g 100 mL/hr over 30 Minutes Intravenous Every 8 hours 06/29/16 1027 07/01/16 2159   06/28/16 0000  vancomycin (VANCOCIN) 1,500 mg in sodium chloride 0.9 % 500 mL IVPB  Status:  Discontinued     1,500 mg 250 mL/hr over 120 Minutes Intravenous Every 12 hours 06/27/16 1206 06/29/16 1027   06/27/16 1800  piperacillin-tazobactam (ZOSYN) IVPB 3.375 g  Status:  Discontinued     3.375 g 12.5 mL/hr over 240 Minutes Intravenous Every 8 hours 06/27/16 1205 06/29/16 1027   06/27/16 1215  piperacillin-tazobactam (ZOSYN) IVPB 3.375 g     3.375 g 100 mL/hr over 30 Minutes Intravenous  Once 06/27/16  1204 06/27/16 1244   06/27/16 1215  vancomycin (VANCOCIN) 1,500 mg in sodium chloride 0.9 % 500 mL IVPB     1,500 mg 250 mL/hr over 120 Minutes Intravenous  Once 06/27/16 1206 06/27/16 1431     Subjective: Pt nonverbal but had multiple episodes of diarrhea overnight  Objective: Vitals:   06/30/16 1518 06/30/16 2117 07/01/16 0547 07/01/16 1121  BP: 121/69 121/70 125/62 129/70  Pulse: 81 96 92 86  Resp:  18 17   Temp:  98.9 F (37.2 C) 97.5 F (36.4  C)   TempSrc:  Oral Axillary   SpO2:  98% 98%   Weight:      Height:        Intake/Output Summary (Last 24 hours) at 07/01/16 1257 Last data filed at 07/01/16 0620  Gross per 24 hour  Intake              580 ml  Output              800 ml  Net             -220 ml   Filed Weights   06/27/16 1132  Weight: 81.6 kg (180 lb)   Exam:  Gen: Pt nonverbal but no apparent distress. Cooperative.   Eyes: PERRL, lids and conjunctivae normal Neck: normal, supple, no masses, no thyromegaly Respiratory: clear to auscultation bilaterally, no wheezing, no crackles. Normal respiratory effort. No accessory muscle use.  Cardiovascular: tachy, no murmurs / rubs / gallops. No extremity edema. 2+ pedal pulses. No carotid bruits.  Abdomen: no tenderness, no masses palpated. No hepatosplenomegaly. Bowel sounds positive.  Musculoskeletal: no clubbing / cyanosis. No joint deformity upper and lower extremities. Good ROM, no contractures. Normal muscle tone.  Skin: acne on face--right hand and finger cellulitis much improved, less redness, still with edema and heat but overall improved. Neurologic: No focal abnormalities.  Psychiatric: Non-verbal at baseline  Data Reviewed: Basic Metabolic Panel:  Recent Labs Lab 06/27/16 1158 06/28/16 0438 06/29/16 0439  NA 137 139 138  K 3.3* 3.2* 4.1  CL 104 108 106  CO2 25 24 25   GLUCOSE 96 96 94  BUN 9 6 <5*  CREATININE 0.73 0.70 0.76  CALCIUM 8.6* 8.2* 9.1   Liver Function Tests:  Recent Labs Lab 06/27/16 1158  AST 16  ALT 18  ALKPHOS 65  BILITOT 1.2  PROT 6.6  ALBUMIN 3.8   No results for input(s): LIPASE, AMYLASE in the last 168 hours. No results for input(s): AMMONIA in the last 168 hours. CBC:  Recent Labs Lab 06/27/16 1158 06/28/16 0438 06/29/16 0439  WBC 11.8* 10.9* 9.6  NEUTROABS 9.0*  --   --   HGB 14.0 12.8* 13.6  HCT 40.1 37.2* 38.7*  MCV 83.9 84.7 83.0  PLT 146* 142* 146*   Cardiac Enzymes: No results for input(s):  CKTOTAL, CKMB, CKMBINDEX, TROPONINI in the last 168 hours. CBG (last 3)  No results for input(s): GLUCAP in the last 72 hours. Recent Results (from the past 240 hour(s))  Urine culture     Status: None   Collection Time: 06/27/16 11:36 AM  Result Value Ref Range Status   Specimen Description URINE, RANDOM  Final   Special Requests NONE  Final   Culture NO GROWTH Performed at [facility]   Final   Report Status 06/28/2016 FINAL  Final  Culture, blood (Routine x 2)     Status: None (Preliminary result)   Collection Time: 06/27/16 11:55 AM  Result Value Ref Range Status   Specimen Description BLOOD RIGHT ANTECUBITAL  Final   Special Requests BOTTLES DRAWN AEROBIC AND ANAEROBIC  Final   Culture   Final    NO GROWTH 4 DAYS Performed at [facility]    Report Status PENDING  Incomplete  Culture, blood (Routine x 2)     Status: None (Preliminary result)   Collection Time: 06/27/16 11:58 AM  Result Value Ref Range Status   Specimen Description BLOOD LEFT ANTECUBITAL  Final   Special Requests BOTTLES DRAWN AEROBIC AND ANAEROBIC  Final   Culture   Final    NO GROWTH 4 DAYS Performed at [facility]    Report Status PENDING  Incomplete  Wound or Superficial Culture     Status: None   Collection Time: 06/27/16 12:31 PM  Result Value Ref Range Status   Specimen Description WOUND RIGHT FINGER  Final   Special Requests NONE  Final   Gram Stain   Final    NO WBC SEEN FEW GRAM POSITIVE COCCI IN PAIRS IN SINGLES RARE GRAM POSITIVE RODS Performed at [facility]    Culture   Final    MODERATE STAPHYLOCOCCUS AUREUS MODERATE PROTEUS MIRABILIS    Report Status 06/29/2016 FINAL  Final   Organism ID, Bacteria STAPHYLOCOCCUS AUREUS  Final   Organism ID, Bacteria PROTEUS MIRABILIS  Final      Susceptibility   Proteus mirabilis - MIC*    AMPICILLIN <=2 SENSITIVE Sensitive     CEFAZOLIN <=4 SENSITIVE Sensitive     CEFEPIME <=1 SENSITIVE Sensitive      CEFTAZIDIME <=1 SENSITIVE Sensitive     CEFTRIAXONE <=1 SENSITIVE Sensitive     CIPROFLOXACIN <=0.25 SENSITIVE Sensitive     GENTAMICIN <=1 SENSITIVE Sensitive     IMIPENEM 2 SENSITIVE Sensitive     TRIMETH/SULFA <=20 SENSITIVE Sensitive     AMPICILLIN/SULBACTAM <=2 SENSITIVE Sensitive     PIP/TAZO <=4 SENSITIVE Sensitive     * MODERATE PROTEUS MIRABILIS   Staphylococcus aureus - MIC*    CIPROFLOXACIN <=0.5 SENSITIVE Sensitive     ERYTHROMYCIN >=8 RESISTANT Resistant     GENTAMICIN <=0.5 SENSITIVE Sensitive     OXACILLIN 0.5 SENSITIVE Sensitive     TETRACYCLINE >=16 RESISTANT Resistant     VANCOMYCIN <=0.5 SENSITIVE Sensitive     TRIMETH/SULFA <=10 SENSITIVE Sensitive     CLINDAMYCIN >=8 RESISTANT Resistant     RIFAMPIN <=0.5 SENSITIVE Sensitive     Inducible Clindamycin NEGATIVE Sensitive     * MODERATE STAPHYLOCOCCUS AUREUS    Studies: No results found. Scheduled Meds: .  ceFAZolin (ANCEF) IV  1 g Intravenous Q8H  . cephALEXin  500 mg Oral Q8H  . chlorproMAZINE  100 mg Oral q morning - 10a  . chlorproMAZINE  200 mg Oral QHS  . cloBAZam  15 mg Oral BID  . enoxaparin (LOVENOX) injection  40 mg Subcutaneous Q24H  . levETIRAcetam  1,000 mg Oral BID  . lidocaine  20 mL Intradermal Once  . loratadine  10 mg Oral Daily  . propranolol  80 mg Oral TID  . saccharomyces boulardii  250 mg Oral BID   Continuous Infusions:   Active Problems:   Behavior disturbance   Autism   Autism spectrum disorder with accompanying language impairment, requiring very substantial support (level 3)   Partial epilepsy with impairment of consciousness (HCC)   Cellulitis and abscess of hand  Time spent:   Standley Dakins, MD, FAAFP  [facility] Pager [phone]  If 7PM-7AM, please contact night-coverage [email or web address] Password [email or web address] 07/01/2016, 12:57 PM    LOS: 4 days

## 2016-07-01 NOTE — Progress Notes (Signed)
Pt more impulsive and agitated this pm. Trying to get OOB. Not following commands. Up to chair then back to bed. Posey belt applied. Reassurance given. Staff x 3 at bedside.

## 2016-07-01 NOTE — Progress Notes (Addendum)
PRN dose of Thorazine 50 mg given po as ordered. Pt resting quietly at present with sitter and tech at bedside. Dr. Laural BenesJohnson text paged regarding pt's recent behavior and need for restraint order.

## 2016-07-01 NOTE — Progress Notes (Signed)
Dr. Laural BenesJohnson aware via phone pt trying to get OOB. More impulsive and agitated this pm. Md aware pt received extra dose of thorazine as ordered PRN within last hour and posey belt applied. Pt not following commands. Currently biting hands intermittently and pushing staff. MD plans to round on pt this evening.

## 2016-07-02 LAB — CULTURE, BLOOD (ROUTINE X 2)
Culture: NO GROWTH
Culture: NO GROWTH

## 2016-07-02 NOTE — Progress Notes (Signed)
Patient has pink area on upper right flank above nonviolent restraint belt.

## 2016-07-02 NOTE — Progress Notes (Signed)
Paged MD about right index finger appearing necrotic at time of dressing change.   Previous appearance not documented in flowsheets. RN would like MD assessment for correct management of problem  MD promptly came to bedside and assessed right index finger and  bilateral thumbs. MD aware of wound appearance

## 2016-07-02 NOTE — Progress Notes (Signed)
Patient is sleeping. Nurse attempted to wake patient to take medication. Patient is not awake enough to swallow pill at this time. Nurse changed time for medication to be taken when patient becomes more alert and awake.

## 2016-07-02 NOTE — Progress Notes (Signed)
PROGRESS NOTE    Nathan Moore  WUJ:[id number]  DOB: 04-06-1996  DOA: 06/27/2016 PCP: Jackie Plum, MD Outpatient Specialists: Dr. Sharene Skeans neurology  [facility] course: HPI: Nathan Moore is a 20 y.o. male with medical history significant of seizures and autism with behavioral disturbances.  History obtained from chart and caregiver at bedside as patient is non verbal at baseline. Patient does walk and feed self normally.   Patient has been having seizures more frequently in last few months.  After his seizures, patient tends to  Rub fingers together for at least 1 hour.  Caregiver has noticed him doing this more than usual.  He has also seen patient chewing on fingers.  Patient was seen on 9/8 in the ER and noticed to have abrasions to fingers at that time.  He was given a tenatuns shot and wound care.  He comes back to the ER with fever and worsening infection.    ED Course: In the ER, he was given tylenol for fever.  ER doc did small I/D of area with purulent material and this was sent for culture.  Ortho was consulted and patient was made NPO for now.  Given vanc/zosyn. Transitioned to cefazolin IV 9/15.     Assessment & Plan:   Autism spectrum disorder with accompanying language impairment, requiring very substantial support (level 3)   Partial epilepsy with impairment of consciousness (HCC)   Cellulitis and abscess of hand   Cellulitis of hands (no osteo on x ray) - clinically improving, WBC trending down, still with edema and heat and redness (less than on admission) -from patient chewing as well as picking at fingers -small I/D at bedside by ER doc--cultures growing pansensitive MSSA and proteus - ordered cefazolin 1 gm IV Q8h, Plan to DC IV antibiotics and transitioning him to oral cephalexin.  -Dr. Rayburn Ma following, continue local wound care, further mgmt per ortho recommendations  Acute Diarrhea - symptoms have resolved.    Uncontrolled seizures -follows with Dr.  Sharene Skeans, continue seizure precautions -no recent medication changes -no seizure activity so far observed in hospital   Autism with behavioral disturbances -chews on fingers as well as rubs fingers together -may need psych consult vs behavior modifications-- gloves (has one now that just covers thumb but leaves finger free and patient chews through glove)  DVT prophylaxis: lovenox Code Status: full Family Communication: caregiver from group home at bedside Disposition Plan: when cleared by ortho can discharge Consults called: ortho general-- no hand on call Admission status: inpt  Antimicrobials: Anti-infectives    Start     Dose/Rate Route Frequency Ordered Stop   07/01/16 2200  cephALEXin (KEFLEX) capsule 500 mg     500 mg Oral Every 8 hours 07/01/16 1037     06/29/16 1200  ceFAZolin (ANCEF) IVPB 1 g/50 mL premix     1 g 100 mL/hr over 30 Minutes Intravenous Every 8 hours 06/29/16 1027 07/01/16 1523   06/28/16 0000  vancomycin (VANCOCIN) 1,500 mg in sodium chloride 0.9 % 500 mL IVPB  Status:  Discontinued     1,500 mg 250 mL/hr over 120 Minutes Intravenous Every 12 hours 06/27/16 1206 06/29/16 1027   06/27/16 1800  piperacillin-tazobactam (ZOSYN) IVPB 3.375 g  Status:  Discontinued     3.375 g 12.5 mL/hr over 240 Minutes Intravenous Every 8 hours 06/27/16 1205 06/29/16 1027   06/27/16 1215  piperacillin-tazobactam (ZOSYN) IVPB 3.375 g     3.375 g 100 mL/hr over 30 Minutes Intravenous  Once 06/27/16 1204 06/27/16  1244   06/27/16 1215  vancomycin (VANCOCIN) 1,500 mg in sodium chloride 0.9 % 500 mL IVPB     1,500 mg 250 mL/hr over 120 Minutes Intravenous  Once 06/27/16 1206 06/27/16 1431     Subjective: Pt has been a lot more restless and agitated in last 24 hours  Objective: Vitals:   07/01/16 1121 07/01/16 1609 07/01/16 2102 07/02/16 0646  BP: 129/70 118/68 127/79 125/78  Pulse: 86 96 91 79  Resp:   20 16  Temp:  98.1 F (36.7 C) 99.5 F (37.5 C) 97.5 F (36.4 C)    TempSrc:  Axillary Oral Oral  SpO2:  100% 99% 97%  Weight:      Height:        Intake/Output Summary (Last 24 hours) at 07/02/16 1139 Last data filed at 07/02/16 1019  Gross per 24 hour  Intake              960 ml  Output             1350 ml  Net             -390 ml   Filed Weights   06/27/16 1132  Weight: 81.6 kg (180 lb)   Exam:  Gen: Pt nonverbal but no apparent distress. Cooperative.   Eyes: PERRL, lids and conjunctivae normal Neck: normal, supple, no masses, no thyromegaly Respiratory: clear to auscultation bilaterally, no wheezing, no crackles. Normal respiratory effort. No accessory muscle use.  Cardiovascular: tachy, no murmurs / rubs / gallops. No extremity edema. 2+ pedal pulses. No carotid bruits.  Abdomen: no tenderness, no masses palpated. No hepatosplenomegaly. Bowel sounds positive.  Musculoskeletal: no clubbing / cyanosis. No joint deformity upper and lower extremities. Good ROM, no contractures. Normal muscle tone.  Skin: acne on face--right hand and finger cellulitis much improved, less redness, still with edema and heat but overall improved. Neurologic: No focal abnormalities.  Psychiatric: Non-verbal at baseline  Data Reviewed: Basic Metabolic Panel:  Recent Labs Lab 06/27/16 1158 06/28/16 0438 06/29/16 0439  NA 137 139 138  K 3.3* 3.2* 4.1  CL 104 108 106  CO2 25 24 25   GLUCOSE 96 96 94  BUN 9 6 <5*  CREATININE 0.73 0.70 0.76  CALCIUM 8.6* 8.2* 9.1   Liver Function Tests:  Recent Labs Lab 06/27/16 1158  AST 16  ALT 18  ALKPHOS 65  BILITOT 1.2  PROT 6.6  ALBUMIN 3.8   No results for input(s): LIPASE, AMYLASE in the last 168 hours. No results for input(s): AMMONIA in the last 168 hours. CBC:  Recent Labs Lab 06/27/16 1158 06/28/16 0438 06/29/16 0439  WBC 11.8* 10.9* 9.6  NEUTROABS 9.0*  --   --   HGB 14.0 12.8* 13.6  HCT 40.1 37.2* 38.7*  MCV 83.9 84.7 83.0  PLT 146* 142* 146*   Cardiac Enzymes: No results for input(s):  CKTOTAL, CKMB, CKMBINDEX, TROPONINI in the last 168 hours. CBG (last 3)  No results for input(s): GLUCAP in the last 72 hours. Recent Results (from the past 240 hour(s))  Urine culture     Status: None   Collection Time: 06/27/16 11:36 AM  Result Value Ref Range Status   Specimen Description URINE, RANDOM  Final   Special Requests NONE  Final   Culture NO GROWTH Performed at [facility]   Final   Report Status 06/28/2016 FINAL  Final  Culture, blood (Routine x 2)     Status: None (Preliminary result)  Collection Time: 06/27/16 11:55 AM  Result Value Ref Range Status   Specimen Description BLOOD RIGHT ANTECUBITAL  Final   Special Requests BOTTLES DRAWN AEROBIC AND ANAEROBIC  Final   Culture   Final    NO GROWTH 4 DAYS Performed at [facility]    Report Status PENDING  Incomplete  Culture, blood (Routine x 2)     Status: None (Preliminary result)   Collection Time: 06/27/16 11:58 AM  Result Value Ref Range Status   Specimen Description BLOOD LEFT ANTECUBITAL  Final   Special Requests BOTTLES DRAWN AEROBIC AND ANAEROBIC  Final   Culture   Final    NO GROWTH 4 DAYS Performed at [facility]    Report Status PENDING  Incomplete  Wound or Superficial Culture     Status: None   Collection Time: 06/27/16 12:31 PM  Result Value Ref Range Status   Specimen Description WOUND RIGHT FINGER  Final   Special Requests NONE  Final   Gram Stain   Final    NO WBC SEEN FEW GRAM POSITIVE COCCI IN PAIRS IN SINGLES RARE GRAM POSITIVE RODS Performed at [facility]    Culture   Final    MODERATE STAPHYLOCOCCUS AUREUS MODERATE PROTEUS MIRABILIS    Report Status 06/29/2016 FINAL  Final   Organism ID, Bacteria STAPHYLOCOCCUS AUREUS  Final   Organism ID, Bacteria PROTEUS MIRABILIS  Final      Susceptibility   Proteus mirabilis - MIC*    AMPICILLIN <=2 SENSITIVE Sensitive     CEFAZOLIN <=4 SENSITIVE Sensitive     CEFEPIME <=1 SENSITIVE Sensitive      CEFTAZIDIME <=1 SENSITIVE Sensitive     CEFTRIAXONE <=1 SENSITIVE Sensitive     CIPROFLOXACIN <=0.25 SENSITIVE Sensitive     GENTAMICIN <=1 SENSITIVE Sensitive     IMIPENEM 2 SENSITIVE Sensitive     TRIMETH/SULFA <=20 SENSITIVE Sensitive     AMPICILLIN/SULBACTAM <=2 SENSITIVE Sensitive     PIP/TAZO <=4 SENSITIVE Sensitive     * MODERATE PROTEUS MIRABILIS   Staphylococcus aureus - MIC*    CIPROFLOXACIN <=0.5 SENSITIVE Sensitive     ERYTHROMYCIN >=8 RESISTANT Resistant     GENTAMICIN <=0.5 SENSITIVE Sensitive     OXACILLIN 0.5 SENSITIVE Sensitive     TETRACYCLINE >=16 RESISTANT Resistant     VANCOMYCIN <=0.5 SENSITIVE Sensitive     TRIMETH/SULFA <=10 SENSITIVE Sensitive     CLINDAMYCIN >=8 RESISTANT Resistant     RIFAMPIN <=0.5 SENSITIVE Sensitive     Inducible Clindamycin NEGATIVE Sensitive     * MODERATE STAPHYLOCOCCUS AUREUS    Studies: No results found. Scheduled Meds: . cephALEXin  500 mg Oral Q8H  . chlorproMAZINE  100 mg Oral q morning - 10a  . chlorproMAZINE  200 mg Oral QHS  . cloBAZam  15 mg Oral BID  . enoxaparin (LOVENOX) injection  40 mg Subcutaneous Q24H  . levETIRAcetam  1,000 mg Oral BID  . lidocaine  20 mL Intradermal Once  . loratadine  10 mg Oral Daily  . propranolol  80 mg Oral TID  . saccharomyces boulardii  250 mg Oral BID   Continuous Infusions:   Active Problems:   Behavior disturbance   Autism   Autism spectrum disorder with accompanying language impairment, requiring very substantial support (level 3)   Partial epilepsy with impairment of consciousness (HCC)   Cellulitis and abscess of hand  Time spent:   Standley Dakins, MD, FAAFP [facility] Pager [phone]  If 7PM-7AM, please contact night-coverage [email or web address] Password [email or web address] 07/02/2016, 11:39 AM    LOS: 5 days

## 2016-07-03 NOTE — Clinical Social Work Note (Signed)
Patient from [facility] and will return once cleared by orthopedics per attending MD.   No further concerns reported at this time. MSW remains available as needed.   Nathan Moore, MSW [phone] 07/03/2016 1:07 PM

## 2016-07-03 NOTE — Progress Notes (Signed)
Patient ID: [name], male   DOB: 07/20/1996, 20 y.o.   MRN: [id number] Johm's left thumb and right index fingers appear stable.  His right index finger is the worst.  It still needs local wound care daily with a small amount of xeroform or bactroban ointment daily followed by dry dressings.  I will need to see him in a week to 2 weeks.

## 2016-07-03 NOTE — Progress Notes (Signed)
Called consulting Orthopedic Physician about assessing patient readiness for discharge in regards to cellulitis and abscesss of fingers.   Orthopedic Physician unable to round at this time due to surgery schedule and on call schedule. Will alert attending MD about orthopedic physician.   Will ask attending MD to call the on call physician for [facility] to assess the patients readiness for discharge.   Paged Dr. Laural BenesJohnson at this time.    Dr. Magnus IvanBlackman came to assess wounds of patient around 1745. Continue with wound dressings until discharge.

## 2016-07-03 NOTE — Progress Notes (Signed)
PROGRESS NOTE    Nathan Moore  ZOX:[id number]  DOB: 05/15/1996  DOA: 06/27/2016 PCP: Jackie Plum, MD Outpatient Specialists: Dr. Sharene Skeans neurology  [facility] course: HPI: Nathan Moore is a 20 y.o. male with medical history significant of seizures and autism with behavioral disturbances.  History obtained from chart and caregiver at bedside as patient is non verbal at baseline. Patient does walk and feed self normally.   Patient has been having seizures more frequently in last few months.  After his seizures, patient tends to  Rub fingers together for at least 1 hour.  Caregiver has noticed him doing this more than usual.  He has also seen patient chewing on fingers.  Patient was seen on 9/8 in the ER and noticed to have abrasions to fingers at that time.  He was given a tenatuns shot and wound care.  He comes back to the ER with fever and worsening infection.    ED Course: In the ER, he was given tylenol for fever.  ER doc did small I/D of area with purulent material and this was sent for culture.  Ortho was consulted and patient was made NPO for now.  Given vanc/zosyn. Transitioned to cefazolin IV 9/15.     Assessment & Plan:   Autism spectrum disorder with accompanying language impairment, requiring very substantial support (level 3)   Partial epilepsy with impairment of consciousness (HCC)   Cellulitis and abscess of hand   Cellulitis of hands (no osteo on x ray) - clinically improving, WBC trending down, still with edema and heat and redness (less than on admission) -from patient chewing as well as picking at fingers -small I/D at bedside by ER doc--cultures growing pansensitive MSSA and proteus - ordered cefazolin 1 gm IV Q8h, Plan to DC IV antibiotics and transitioning him to oral cephalexin.  -Dr. Rayburn Ma following, continue local wound care, further mgmt per ortho recommendations  Acute Diarrhea - symptoms have resolved.    Uncontrolled seizures -follows with Dr.  Sharene Skeans, continue seizure precautions -no recent medication changes -no seizure activity so far observed in hospital   Autism with behavioral disturbances -chews on fingers as well as rubs fingers together -may need psych consult vs behavior modifications-- gloves (has one now that just covers thumb but leaves finger free and patient chews through glove)  DVT prophylaxis: lovenox Code Status: full Family Communication: caregiver from group home at bedside Disposition Plan: when cleared by ortho can discharge back to facility Consults called: ortho general-- no hand on call Admission status: inpt  Antimicrobials: Anti-infectives    Start     Dose/Rate Route Frequency Ordered Stop   07/01/16 2200  cephALEXin (KEFLEX) capsule 500 mg     500 mg Oral Every 8 hours 07/01/16 1037     06/29/16 1200  ceFAZolin (ANCEF) IVPB 1 g/50 mL premix     1 g 100 mL/hr over 30 Minutes Intravenous Every 8 hours 06/29/16 1027 07/01/16 1523   06/28/16 0000  vancomycin (VANCOCIN) 1,500 mg in sodium chloride 0.9 % 500 mL IVPB  Status:  Discontinued     1,500 mg 250 mL/hr over 120 Minutes Intravenous Every 12 hours 06/27/16 1206 06/29/16 1027   06/27/16 1800  piperacillin-tazobactam (ZOSYN) IVPB 3.375 g  Status:  Discontinued     3.375 g 12.5 mL/hr over 240 Minutes Intravenous Every 8 hours 06/27/16 1205 06/29/16 1027   06/27/16 1215  piperacillin-tazobactam (ZOSYN) IVPB 3.375 g     3.375 g 100 mL/hr over 30 Minutes Intravenous  Once  06/27/16 1204 06/27/16 1244   06/27/16 1215  vancomycin (VANCOCIN) 1,500 mg in sodium chloride 0.9 % 500 mL IVPB     1,500 mg 250 mL/hr over 120 Minutes Intravenous  Once 06/27/16 1206 06/27/16 1431     Subjective: Pt has been a lot more restless and agitated in last 24 hours  Objective: Vitals:   07/02/16 1349 07/02/16 1605 07/02/16 2105 07/03/16 0554  BP: (!) 104/52 126/83 (!) 101/59 (!) 105/57  Pulse: 88  89 87  Resp: 16  16 16   Temp: 98 F (36.7 C)  99 F  (37.2 C) 98.8 F (37.1 C)  TempSrc: Oral  Oral Oral  SpO2: 95%  99% 99%  Weight:      Height:        Intake/Output Summary (Last 24 hours) at 07/03/16 1100 Last data filed at 07/03/16 1034  Gross per 24 hour  Intake              840 ml  Output             1301 ml  Net             -461 ml   Filed Weights   06/27/16 1132  Weight: 81.6 kg (180 lb)   Exam:  Gen: Pt nonverbal but no apparent distress. Cooperative.   Eyes: PERRL, lids and conjunctivae normal Neck: normal, supple, no masses, no thyromegaly Respiratory: clear to auscultation bilaterally, no wheezing, no crackles. Normal respiratory effort. No accessory muscle use.  Cardiovascular: tachy, no murmurs / rubs / gallops. No extremity edema. 2+ pedal pulses. No carotid bruits.  Abdomen: no tenderness, no masses palpated. No hepatosplenomegaly. Bowel sounds positive.  Musculoskeletal: no clubbing / cyanosis. No joint deformity upper and lower extremities. Good ROM, no contractures. Normal muscle tone.  Skin: acne on face--right hand and finger cellulitis much improved, less redness, still with edema and heat but overall improved. Neurologic: No focal abnormalities.  Psychiatric: Non-verbal at baseline  Data Reviewed: Basic Metabolic Panel:  Recent Labs Lab 06/27/16 1158 06/28/16 0438 06/29/16 0439  NA 137 139 138  K 3.3* 3.2* 4.1  CL 104 108 106  CO2 25 24 25   GLUCOSE 96 96 94  BUN 9 6 <5*  CREATININE 0.73 0.70 0.76  CALCIUM 8.6* 8.2* 9.1   Liver Function Tests:  Recent Labs Lab 06/27/16 1158  AST 16  ALT 18  ALKPHOS 65  BILITOT 1.2  PROT 6.6  ALBUMIN 3.8   No results for input(s): LIPASE, AMYLASE in the last 168 hours. No results for input(s): AMMONIA in the last 168 hours. CBC:  Recent Labs Lab 06/27/16 1158 06/28/16 0438 06/29/16 0439  WBC 11.8* 10.9* 9.6  NEUTROABS 9.0*  --   --   HGB 14.0 12.8* 13.6  HCT 40.1 37.2* 38.7*  MCV 83.9 84.7 83.0  PLT 146* 142* 146*   Cardiac Enzymes: No  results for input(s): CKTOTAL, CKMB, CKMBINDEX, TROPONINI in the last 168 hours. CBG (last 3)  No results for input(s): GLUCAP in the last 72 hours. Recent Results (from the past 240 hour(s))  Urine culture     Status: None   Collection Time: 06/27/16 11:36 AM  Result Value Ref Range Status   Specimen Description URINE, RANDOM  Final   Special Requests NONE  Final   Culture [facility]TH [facility]   Final   Report Status 06/28/2016 FINAL  Final  Culture, blood (Routine x 2)     Status:  None   Collection Time: 06/27/16 11:55 AM  Result Value Ref Range Status   Specimen Description BLOOD RIGHT ANTECUBITAL  Final   Special Requests BOTTLES DRAWN AEROBIC AND ANAEROBIC  Final   Culture   Final    NO GROWTH 5 DAYS Performed at [facility]    Report Status 07/02/2016 FINAL  Final  Culture, blood (Routine x 2)     Status: None   Collection Time: 06/27/16 11:58 AM  Result Value Ref Range Status   Specimen Description BLOOD LEFT ANTECUBITAL  Final   Special Requests BOTTLES DRAWN AEROBIC AND ANAEROBIC  Final   Culture   Final    NO GROWTH 5 DAYS Performed at [facility]    Report Status 07/02/2016 FINAL  Final  Wound or Superficial Culture     Status: None   Collection Time: 06/27/16 12:31 PM  Result Value Ref Range Status   Specimen Description WOUND RIGHT FINGER  Final   Special Requests NONE  Final   Gram Stain   Final    NO WBC SEEN FEW GRAM POSITIVE COCCI IN PAIRS IN SINGLES RARE GRAM POSITIVE RODS Performed at [facility]    Culture   Final    MODERATE STAPHYLOCOCCUS AUREUS MODERATE PROTEUS MIRABILIS    Report Status 06/29/2016 FINAL  Final   Organism ID, Bacteria STAPHYLOCOCCUS AUREUS  Final   Organism ID, Bacteria PROTEUS MIRABILIS  Final      Susceptibility   Proteus mirabilis - MIC*    AMPICILLIN <=2 SENSITIVE Sensitive     CEFAZOLIN <=4 SENSITIVE Sensitive     CEFEPIME <=1 SENSITIVE Sensitive     CEFTAZIDIME  <=1 SENSITIVE Sensitive     CEFTRIAXONE <=1 SENSITIVE Sensitive     CIPROFLOXACIN <=0.25 SENSITIVE Sensitive     GENTAMICIN <=1 SENSITIVE Sensitive     IMIPENEM 2 SENSITIVE Sensitive     TRIMETH/SULFA <=20 SENSITIVE Sensitive     AMPICILLIN/SULBACTAM <=2 SENSITIVE Sensitive     PIP/TAZO <=4 SENSITIVE Sensitive     * MODERATE PROTEUS MIRABILIS   Staphylococcus aureus - MIC*    CIPROFLOXACIN <=0.5 SENSITIVE Sensitive     ERYTHROMYCIN >=8 RESISTANT Resistant     GENTAMICIN <=0.5 SENSITIVE Sensitive     OXACILLIN 0.5 SENSITIVE Sensitive     TETRACYCLINE >=16 RESISTANT Resistant     VANCOMYCIN <=0.5 SENSITIVE Sensitive     TRIMETH/SULFA <=10 SENSITIVE Sensitive     CLINDAMYCIN >=8 RESISTANT Resistant     RIFAMPIN <=0.5 SENSITIVE Sensitive     Inducible Clindamycin NEGATIVE Sensitive     * MODERATE STAPHYLOCOCCUS AUREUS    Studies: No results found. Scheduled Meds: . cephALEXin  500 mg Oral Q8H  . chlorproMAZINE  100 mg Oral q morning - 10a  . chlorproMAZINE  200 mg Oral QHS  . cloBAZam  15 mg Oral BID  . enoxaparin (LOVENOX) injection  40 mg Subcutaneous Q24H  . levETIRAcetam  1,000 mg Oral BID  . lidocaine  20 mL Intradermal Once  . loratadine  10 mg Oral Daily  . propranolol  80 mg Oral TID  . saccharomyces boulardii  250 mg Oral BID   Continuous Infusions:   Active Problems:   Behavior disturbance   Autism   Autism spectrum disorder with accompanying language impairment, requiring very substantial support (level 3)   Partial epilepsy with impairment of consciousness (HCC)   Cellulitis and abscess of hand  Time spent:   Standley Dakins, MD, FAAFP [facility]  Pager [phone]  If 7PM-7AM, please contact night-coverage [email or web address] Password [email or web address] 07/03/2016, 11:00 AM    LOS: 6 days

## 2016-07-03 NOTE — Discharge Instructions (Signed)
Xeroform and dry dressing to left thumb wound and right index finger wound daily followed by dry protective dressings

## 2016-07-04 DIAGNOSIS — L03119 Cellulitis of unspecified part of limb: Secondary | ICD-10-CM

## 2016-07-04 DIAGNOSIS — G40209 Localization-related (focal) (partial) symptomatic epilepsy and epileptic syndromes with complex partial seizures, not intractable, without status epilepticus: Secondary | ICD-10-CM

## 2016-07-04 DIAGNOSIS — F919 Conduct disorder, unspecified: Secondary | ICD-10-CM

## 2016-07-04 DIAGNOSIS — F840 Autistic disorder: Secondary | ICD-10-CM

## 2016-07-04 DIAGNOSIS — L02519 Cutaneous abscess of unspecified hand: Secondary | ICD-10-CM

## 2016-07-04 LAB — CREATININE, SERUM
CREATININE: 0.77 mg/dL (ref 0.61–1.24)
GFR calc Af Amer: >60 mL/min (ref >60)

## 2016-07-04 MED ORDER — SULFAMETHOXAZOLE-TRIMETHOPRIM 800-160 MG PO TABS
1.0000 | ORAL_TABLET | Freq: Two times a day (BID) | ORAL | Status: DC
  Administered 2016-07-04 – 2016-07-05 (×3): 1 via ORAL
  Filled 2016-07-04 (×3): qty 1

## 2016-07-04 NOTE — Clinical Social Work Note (Signed)
MSW spoke with [facility] QP, Con Memoslex Young in regards to potential discharge for today. Mr Maple HudsonYoung to transport patient back to group home once medically stable.   Mr. Maple HudsonYoung also reported that group home is able to manage to care and dressing changes of fingers.   [facility].   No further concerns reported by group home staff. MSW to contact patient's parents at discharge as well. MSW remains available as needed.   Derenda FennelBashira Christinna Sprung, MSW [phone] 07/04/2016 3:18 PM

## 2016-07-04 NOTE — Progress Notes (Addendum)
PROGRESS NOTE    Nathan Moore  ZOX:[id number]  DOB: 11-16-1995  DOA: 06/27/2016 PCP: Jackie Plum, MD Outpatient Specialists: Dr. Sharene Skeans neurology  [facility] course: HPI: Nathan Moore is a 20 y.o. male with medical history significant of seizures and autism with behavioral disturbances.  History obtained from chart and caregiver at bedside as patient is non verbal at baseline. Patient does walk and feed self normally.   Patient has been having seizures more frequently in last few months.  After his seizures, patient tends to  Rub fingers together for at least 1 hour.  Caregiver has noticed him doing this more than usual.  He has also seen patient chewing on fingers.  Patient was seen on 9/8 in the ER and noticed to have abrasions to fingers at that time.  He was given a tenatuns shot and wound care.  He comes back to the ER with fever and worsening infection.    ED Course: In the ER, he was given tylenol for fever.  ER doc did small I/D of area with purulent material and this was sent for culture.  Ortho was consulted and patient was made NPO for now.  Given vanc/zosyn. Transitioned to cefazolin IV 9/15.     Assessment & Plan:   Autism spectrum disorder with accompanying language impairment, requiring very substantial support (level 3)   Partial epilepsy with impairment of consciousness (HCC)   Cellulitis and abscess of hand     Cellulitis of hands (no osteo on x ray) - clinically improving, WBC trending down, still with edema and heat and redness (less than on admission) -from patient chewing as well as picking at fingers -small I/D at bedside by ER doc--cultures growing pansensitive MSSA and proteus - ordered cefazolin 1 gm IV Q8h, patient now transition to oral Bactrim, will continue for 10 days -Dr. Rayburn Ma following, continue local wound care, further mgmt per ortho recommendations  right index finger  still needs local wound care daily with a small amount of xeroform or  bactroban ointment daily followed by dry dressings   Acute Diarrhea - symptoms have resolved.    Uncontrolled seizures -follows with Dr. Sharene Skeans, continue seizure precautions -no recent medication changes -no seizure activity so far observed in hospital   Autism with behavioral disturbances -chews on fingers as well as rubs fingers together     DVT prophylaxis: lovenox Code Status: full Family Communication: caregiver from group home at bedside Disposition Plan: when cleared by ortho can discharge back to facility Consults called: ortho general-- no hand on call Admission status: inpt  Antimicrobials: Anti-infectives    Start     Dose/Rate Route Frequency Ordered Stop   07/01/16 2200  cephALEXin (KEFLEX) capsule 500 mg     500 mg Oral Every 8 hours 07/01/16 1037     06/29/16 1200  ceFAZolin (ANCEF) IVPB 1 g/50 mL premix     1 g 100 mL/hr over 30 Minutes Intravenous Every 8 hours 06/29/16 1027 07/01/16 1523   06/28/16 0000  vancomycin (VANCOCIN) 1,500 mg in sodium chloride 0.9 % 500 mL IVPB  Status:  Discontinued     1,500 mg 250 mL/hr over 120 Minutes Intravenous Every 12 hours 06/27/16 1206 06/29/16 1027   06/27/16 1800  piperacillin-tazobactam (ZOSYN) IVPB 3.375 g  Status:  Discontinued     3.375 g 12.5 mL/hr over 240 Minutes Intravenous Every 8 hours 06/27/16 1205 06/29/16 1027   06/27/16 1215  piperacillin-tazobactam (ZOSYN) IVPB 3.375 g     3.375 g 100 mL/hr  over 30 Minutes Intravenous  Once 06/27/16 1204 06/27/16 1244   06/27/16 1215  vancomycin (VANCOCIN) 1,500 mg in sodium chloride 0.9 % 500 mL IVPB     1,500 mg 250 mL/hr over 120 Minutes Intravenous  Once 06/27/16 1206 06/27/16 1431     Subjective: Patient does not communicate at all, slumped over onto the left side  Objective: Vitals:   07/03/16 1357 07/03/16 1653 07/03/16 2125 07/04/16 0514  BP: (!) 103/59  (!) 108/53 100/78  Pulse: 94 97 98 75  Resp: 18  20 18   Temp: 98.6 F (37 C)  98 F (36.7  C) 97.6 F (36.4 C)  TempSrc: Axillary  Axillary Axillary  SpO2: 98%  98% 98%  Weight:      Height:        Intake/Output Summary (Last 24 hours) at 07/04/16 0838 Last data filed at 07/04/16 0515  Gross per 24 hour  Intake              120 ml  Output              875 ml  Net             -755 ml   Filed Weights   06/27/16 1132  Weight: 81.6 kg (180 lb)   Exam:  Gen: Pt nonverbal but no apparent distress. Cooperative.   Eyes: PERRL, lids and conjunctivae normal Neck: normal, supple, no masses, no thyromegaly Respiratory: clear to auscultation bilaterally, no wheezing, no crackles. Normal respiratory effort. No accessory muscle use.  Cardiovascular: tachy, no murmurs / rubs / gallops. No extremity edema. 2+ pedal pulses. No carotid bruits.  Abdomen: no tenderness, no masses palpated. No hepatosplenomegaly. Bowel sounds positive.  Musculoskeletal: no clubbing / cyanosis. No joint deformity upper and lower extremities. Good ROM, no contractures. Normal muscle tone.  Skin: acne on face--right hand and finger cellulitis much improved, less redness, still with edema and heat but overall improved. Neurologic: No focal abnormalities.  Psychiatric: Non-verbal at baseline  Data Reviewed: Basic Metabolic Panel:  Recent Labs Lab 06/27/16 1158 06/28/16 0438 06/29/16 0439 07/04/16 0429  NA 137 139 138  --   K 3.3* 3.2* 4.1  --   CL 104 108 106  --   CO2 25 24 25   --   GLUCOSE 96 96 94  --   BUN 9 6 <5*  --   CREATININE 0.73 0.70 0.76 0.77  CALCIUM 8.6* 8.2* 9.1  --    Liver Function Tests:  Recent Labs Lab 06/27/16 1158  AST 16  ALT 18  ALKPHOS 65  BILITOT 1.2  PROT 6.6  ALBUMIN 3.8   No results for input(s): LIPASE, AMYLASE in the last 168 hours. No results for input(s): AMMONIA in the last 168 hours. CBC:  Recent Labs Lab 06/27/16 1158 06/28/16 0438 06/29/16 0439  WBC 11.8* 10.9* 9.6  NEUTROABS 9.0*  --   --   HGB 14.0 12.8* 13.6  HCT 40.1 37.2* 38.7*  MCV  83.9 84.7 83.0  PLT 146* 142* 146*   Cardiac Enzymes: No results for input(s): CKTOTAL, CKMB, CKMBINDEX, TROPONINI in the last 168 hours. CBG (last 3)  No results for input(s): GLUCAP in the last 72 hours. Recent Results (from the past 240 hour(s))  Urine culture     Status: None   Collection Time: 06/27/16 11:36 AM  Result Value Ref Range Status   Specimen Description URINE, RANDOM  Final   Special Requests NONE  Final   Culture  NO GROWTH Performed at [facility]   Final   Report Status 06/28/2016 FINAL  Final  Culture, blood (Routine x 2)     Status: None   Collection Time: 06/27/16 11:55 AM  Result Value Ref Range Status   Specimen Description BLOOD RIGHT ANTECUBITAL  Final   Special Requests BOTTLES DRAWN AEROBIC AND ANAEROBIC  Final   Culture   Final    NO GROWTH 5 DAYS Performed at [facility]    Report Status 07/02/2016 FINAL  Final  Culture, blood (Routine x 2)     Status: None   Collection Time: 06/27/16 11:58 AM  Result Value Ref Range Status   Specimen Description BLOOD LEFT ANTECUBITAL  Final   Special Requests BOTTLES DRAWN AEROBIC AND ANAEROBIC  Final   Culture   Final    NO GROWTH 5 DAYS Performed at [facility]    Report Status 07/02/2016 FINAL  Final  Wound or Superficial Culture     Status: None   Collection Time: 06/27/16 12:31 PM  Result Value Ref Range Status   Specimen Description WOUND RIGHT FINGER  Final   Special Requests NONE  Final   Gram Stain   Final    NO WBC SEEN FEW GRAM POSITIVE COCCI IN PAIRS IN SINGLES RARE GRAM POSITIVE RODS Performed at [facility]    Culture   Final    MODERATE STAPHYLOCOCCUS AUREUS MODERATE PROTEUS MIRABILIS    Report Status 06/29/2016 FINAL  Final   Organism ID, Bacteria STAPHYLOCOCCUS AUREUS  Final   Organism ID, Bacteria PROTEUS MIRABILIS  Final      Susceptibility   Proteus mirabilis - MIC*    AMPICILLIN <=2 SENSITIVE Sensitive     CEFAZOLIN <=4 SENSITIVE  Sensitive     CEFEPIME <=1 SENSITIVE Sensitive     CEFTAZIDIME <=1 SENSITIVE Sensitive     CEFTRIAXONE <=1 SENSITIVE Sensitive     CIPROFLOXACIN <=0.25 SENSITIVE Sensitive     GENTAMICIN <=1 SENSITIVE Sensitive     IMIPENEM 2 SENSITIVE Sensitive     TRIMETH/SULFA <=20 SENSITIVE Sensitive     AMPICILLIN/SULBACTAM <=2 SENSITIVE Sensitive     PIP/TAZO <=4 SENSITIVE Sensitive     * MODERATE PROTEUS MIRABILIS   Staphylococcus aureus - MIC*    CIPROFLOXACIN <=0.5 SENSITIVE Sensitive     ERYTHROMYCIN >=8 RESISTANT Resistant     GENTAMICIN <=0.5 SENSITIVE Sensitive     OXACILLIN 0.5 SENSITIVE Sensitive     TETRACYCLINE >=16 RESISTANT Resistant     VANCOMYCIN <=0.5 SENSITIVE Sensitive     TRIMETH/SULFA <=10 SENSITIVE Sensitive     CLINDAMYCIN >=8 RESISTANT Resistant     RIFAMPIN <=0.5 SENSITIVE Sensitive     Inducible Clindamycin NEGATIVE Sensitive     * MODERATE STAPHYLOCOCCUS AUREUS    Studies: No results found. Scheduled Meds: . cephALEXin  500 mg Oral Q8H  . chlorproMAZINE  100 mg Oral q morning - 10a  . chlorproMAZINE  200 mg Oral QHS  . cloBAZam  15 mg Oral BID  . enoxaparin (LOVENOX) injection  40 mg Subcutaneous Q24H  . levETIRAcetam  1,000 mg Oral BID  . lidocaine  20 mL Intradermal Once  . loratadine  10 mg Oral Daily  . propranolol  80 mg Oral TID  . saccharomyces boulardii  250 mg Oral BID   Continuous Infusions:   Active Problems:   Behavior disturbance   Autism   Autism spectrum disorder with accompanying language impairment, requiring very substantial support (  level 3)   Partial epilepsy with impairment of consciousness (HCC)   Cellulitis and abscess of hand  Time spent:   Richarda Overlie, MD, FAAFP [facility] Pager [phone]   If 7PM-7AM, please contact night-coverage [email or web address] Password [email or web address] 07/04/2016, 8:38 AM    LOS: 7 days

## 2016-07-04 NOTE — Care Management Note (Signed)
Case Management Note  Patient Details  Name: Nathan Moore MRN: [id number] Date of Birth: 1996-05-06  Subjective/Objective:                  cellulitis Action/Plan: Discharge planning Expected Discharge Date:   (unknown)               Expected Discharge Plan:  Group Home  In-House Referral:     Discharge planning Services  CM Consult  Post Acute Care Choice:  Home Health Choice offered to:  Parent  DME Arranged:    DME Agency:  [facility]  HH [facility]  [facility]  [facility]  Status of Service:  In process, will continue to follow  If discussed at Long Length of Stay Meetings, dates discussed:    Additional Comments: CM spoke with mother of pt, Melissa to discuss home health needs for dressing changes.  Choice of home health agency.  Melissa states [facility].  CM spoke with Con MemosAlex Young [phone], caretaker at [facility] who states he is willing to be teachable source for continued dressing changes.  [facility] Darl PikesSusan.  CM notified MD to please place [facility] for specific dresing change instructions and face to face.  [facility] aware orders and face to face are to come and given information for Con MemosAlex Young as caretaker.  CM spoke with CSW Adria DevonBashira who is [facility] and this CM will continue to follow for progress. Yves DillJeffries, Codee Bloodworth Christine, RN 07/04/2016, 12:21 PM

## 2016-07-05 LAB — COMPREHENSIVE METABOLIC PANEL
ALT: 46 U/L (ref 17–63)
AST: 28 U/L (ref 15–41)
Albumin: 3.9 g/dL (ref 3.5–5.0)
Alkaline Phosphatase: 65 U/L (ref 38–126)
Anion gap: 7 (ref 5–15)
BUN: 14 mg/dL (ref 6–20)
CHLORIDE: 103 mmol/L (ref 101–111)
CO2: 30 mmol/L (ref 22–32)
Calcium: 9.4 mg/dL (ref 8.9–10.3)
Creatinine, Ser: 0.98 mg/dL (ref 0.61–1.24)
GFR calc Af Amer: >60 mL/min (ref >60)
Glucose, Bld: 88 mg/dL (ref 65–99)
POTASSIUM: 4 mmol/L (ref 3.5–5.1)
SODIUM: 140 mmol/L (ref 135–145)
Total Bilirubin: 0.7 mg/dL (ref 0.3–1.2)
Total Protein: 6.9 g/dL (ref 6.5–8.1)

## 2016-07-05 LAB — CBC
HCT: 43.3 % (ref 39.0–52.0)
Hemoglobin: 15.4 g/dL (ref 13.0–17.0)
MCH: 29.4 pg (ref 26.0–34.0)
MCHC: 35.6 g/dL (ref 30.0–36.0)
MCV: 82.6 fL (ref 78.0–100.0)
PLATELETS: 222 10*3/uL (ref 150–400)
RBC: 5.24 MIL/uL (ref 4.22–5.81)
RDW: 12.1 % (ref 11.5–15.5)
WBC: 10.9 10*3/uL — AB (ref 4.0–10.5)

## 2016-07-05 MED ORDER — SACCHAROMYCES BOULARDII 250 MG PO CAPS: 250.0000 mg | ORAL_CAPSULE | Freq: Two times a day (BID) | ORAL | 0 refills | Status: DC

## 2016-07-05 MED ORDER — SULFAMETHOXAZOLE-TRIMETHOPRIM 800-160 MG PO TABS: 1.0000 | ORAL_TABLET | Freq: Two times a day (BID) | ORAL | 0 refills | Status: AC

## 2016-07-05 NOTE — Progress Notes (Signed)
CM notified [facility] reps, Melissa and Darl PikesSusan (via voicemails) of pt discharge so SOC can be arranged.  No other CM needs were communicated.

## 2016-07-05 NOTE — Clinical Social Work Note (Signed)
RNCM has arranged HHRN home care with group home for daily dressing changes etc.   Medical Social Worker facilitated patient discharge including contacting patient family and facility to confirm patient discharge plans.  Clinical information faxed to facility and family agreeable with plan.  QP, Con MemosAlex Young [facility]  RN to call report prior to discharge to Con MemosAlex Young [phone].  Medical Social Worker will sign off for now as social work intervention is no longer needed. Please consult us again if new need arises.  Derenda FennelBashira Ezariah Nace, MSW [phone] 07/05/2016 10:34 AM

## 2016-07-05 NOTE — Discharge Summary (Signed)
Physician Discharge Summary  Nathan Moore MRN: [id number] DOB/AGE: 02-Nov-1995 20 y.o.  PCP: Benito Mccreedy, MD   Admit date: 06/27/2016 Discharge date: 07/05/2016  Discharge Diagnoses:    Active Problems:   Behavior disturbance   Autism   Autism spectrum disorder with accompanying language impairment, requiring very substantial support (level 3)   Partial epilepsy with impairment of consciousness (HCC)   Cellulitis and abscess of hand    Follow-up recommendations Follow-up with PCP in 3-5 days , including all  additional recommended appointments as below Follow-up CBC, CMP in 3-5 days Continue  local wound care daily with a small amount of xeroform or bactroban ointment daily  Follow-up with  Mcarthur Rossetti, MD in 2 weeks Patient needs outpatient psychiatric evaluation for erratic behaviors    Current Discharge Medication List    START taking these medications   Details  saccharomyces boulardii (FLORASTOR) 250 MG capsule Take 1 capsule (250 mg total) by mouth 2 (two) times daily. Qty: 30 capsule, Refills: 0    sulfamethoxazole-trimethoprim (BACTRIM DS,SEPTRA DS) 800-160 MG tablet Take 1 tablet by mouth every 12 (twelve) hours. Qty: 24 tablet, Refills: 0      CONTINUE these medications which have NOT CHANGED   Details  cetirizine (ZYRTEC) 10 MG tablet Take 10 mg by mouth daily.    !! chlorproMAZINE (THORAZINE) 100 MG tablet Take 100-200 mg by mouth 2 (two) times daily. Take '100mg'$  in the morning and then '200mg'$  at night    !! chlorproMAZINE (THORAZINE) 50 MG tablet TAKE 1 TABLET BY MOUTH EVERY 4 HOURS AS NEEDED FOR AGITATION Refills: 1    levETIRAcetam (KEPPRA) 500 MG tablet Take 2 tablets twice per day Qty: 120 tablet, Refills: 2   Associated Diagnoses: Partial epilepsy with impairment of consciousness (HCC)    ONFI 10 MG tablet TAKE ONE AND A HALF TABLETS BY MOUTH 2 TIMES DAILY Qty: 90 tablet, Refills: 3    propranolol (INDERAL) 80 MG tablet Take 80  mg by mouth 3 (three) times daily. Refills: 5     !! - Potential duplicate medications found. Please discuss with provider.    STOP taking these medications     doxycycline (VIBRA-TABS) 100 MG tablet          Discharge Condition: *Stable    Discharge Instructions Get Medicines reviewed and adjusted: Please take all your medications with you for your next visit with your Primary MD  Please request your Primary MD to go over all hospital tests and procedure/radiological results at the follow up, please ask your Primary MD to get all Hospital records sent to his/her office.  If you experience worsening of your admission symptoms, develop shortness of breath, life threatening emergency, suicidal or homicidal thoughts you must seek medical attention immediately by calling 911 or calling your MD immediately if symptoms less severe.  You must read complete instructions/literature along with all the possible adverse reactions/side effects for all the Medicines you take and that have been prescribed to you. Take any new Medicines after you have completely understood and accpet all the possible adverse reactions/side effects.   Do not drive when taking Pain medications.   Do not take more than prescribed Pain, Sleep and Anxiety Medications  Special Instructions: If you have smoked or chewed Tobacco in the last 2 yrs please stop smoking, stop any regular Alcohol and or any Recreational drug use.  Wear Seat belts while driving.  Please note  You were cared for by a hospitalist during your hospital stay.  Once you are discharged, your primary care physician will handle any further medical issues. Please note that NO REFILLS for any discharge medications will be authorized once you are discharged, as it is imperative that you return to your primary care physician (or establish a relationship with a primary care physician if you do not have one) for your aftercare needs so that they can  reassess your need for medications and monitor your lab values.  Discharge Instructions    Diet - low sodium heart healthy    Complete by:  As directed    Increase activity slowly    Complete by:  As directed        No Known Allergies    Disposition: Group home   Consults: Orthopedics     Significant Diagnostic Studies:  Dg Chest Portable 1 View  Result Date: 06/27/2016 CLINICAL DATA:  Sepsis.  Autism. EXAM: PORTABLE CHEST 1 VIEW COMPARISON:  None. FINDINGS: The heart size and mediastinal contours are within normal limits. Both lungs are clear. The visualized skeletal structures are unremarkable. IMPRESSION: No active disease. Electronically Signed   By: Earle Gell M.D.   On: 06/27/2016 12:31   Dg Hand Complete Right  Result Date: 06/27/2016 CLINICAL DATA:  Swelling, erythema, and whose in wounds of the right index and middle fingers. The patient has been scratching this region. EXAM: RIGHT HAND - COMPLETE 3+ VIEW COMPARISON:  None in PACs FINDINGS: There is marked soft tissue swelling of the index finger with milder swelling of the middle finger. No foreign bodies or gas collections are observed. No underlying bony changes are demonstrated. Elsewhere the bones of the right hand are normal. IMPRESSION: Findings compatible with cellulitis of the index and third fingers. No evidence of osteomyelitis. Electronically Signed   By: David  [place]ique M.D.   On: 06/27/2016 12:33        Filed Weights   06/27/16 1132  Weight: 81.6 kg (180 lb)     Microbiology: Recent Results (from the past 240 hour(s))  Urine culture     Status: None   Collection Time: 06/27/16 11:36 AM  Result Value Ref Range Status   Specimen Description URINE, RANDOM  Final   Special Requests NONE  Final   Culture NO GROWTH Performed at [facility]   Final   Report Status 06/28/2016 FINAL  Final  Culture, blood (Routine x 2)     Status: None   Collection Time: 06/27/16 11:55 AM  Result Value Ref  Range Status   Specimen Description BLOOD RIGHT ANTECUBITAL  Final   Special Requests BOTTLES DRAWN AEROBIC AND ANAEROBIC 5ML  Final   Culture   Final    NO GROWTH 5 DAYS Performed at [facility]    Report Status 07/02/2016 FINAL  Final  Culture, blood (Routine x 2)     Status: None   Collection Time: 06/27/16 11:58 AM  Result Value Ref Range Status   Specimen Description BLOOD LEFT ANTECUBITAL  Final   Special Requests BOTTLES DRAWN AEROBIC AND ANAEROBIC 5ML  Final   Culture   Final    NO GROWTH 5 DAYS Performed at [facility]    Report Status 07/02/2016 FINAL  Final  Wound or Superficial Culture     Status: None   Collection Time: 06/27/16 12:31 PM  Result Value Ref Range Status   Specimen Description WOUND RIGHT FINGER  Final   Special Requests NONE  Final   Gram Stain   Final  NO WBC SEEN FEW GRAM POSITIVE COCCI IN PAIRS IN SINGLES RARE GRAM POSITIVE RODS Performed at [facility]    Culture   Final    [place]    Report Status 06/29/2016 FINAL  Final   Organism ID, Bacteria STAPHYLOCOCCUS AUREUS  Final   Organism ID, Bacteria PROTEUS MIRABILIS  Final      Susceptibility   Proteus mirabilis - MIC*    AMPICILLIN <=2 SENSITIVE Sensitive     CEFAZOLIN <=4 SENSITIVE Sensitive     CEFEPIME <=1 SENSITIVE Sensitive     CEFTAZIDIME <=1 SENSITIVE Sensitive     CEFTRIAXONE <=1 SENSITIVE Sensitive     CIPROFLOXACIN <=0.25 SENSITIVE Sensitive     GENTAMICIN <=1 SENSITIVE Sensitive     IMIPENEM 2 SENSITIVE Sensitive     TRIMETH/SULFA <=20 SENSITIVE Sensitive     AMPICILLIN/SULBACTAM <=2 SENSITIVE Sensitive     PIP/TAZO <=4 SENSITIVE Sensitive     * MODERATE PROTEUS MIRABILIS   Staphylococcus aureus - MIC*    CIPROFLOXACIN <=0.5 SENSITIVE Sensitive     ERYTHROMYCIN >=8 RESISTANT Resistant     GENTAMICIN <=0.5 SENSITIVE Sensitive     OXACILLIN 0.5 SENSITIVE Sensitive     TETRACYCLINE >=16 RESISTANT  Resistant     VANCOMYCIN <=0.5 SENSITIVE Sensitive     TRIMETH/SULFA <=10 SENSITIVE Sensitive     CLINDAMYCIN >=8 RESISTANT Resistant     RIFAMPIN <=0.5 SENSITIVE Sensitive     Inducible Clindamycin NEGATIVE Sensitive     * MODERATE STAPHYLOCOCCUS AUREUS       Blood Culture    Component Value Date/Time   SDES WOUND RIGHT FINGER 06/27/2016 1231   SPECREQUEST NONE 06/27/2016 1231   CULT  06/27/2016 1231    MODERATE STAPHYLOCOCCUS AUREUS MODERATE PROTEUS MIRABILIS    REPTSTATUS 06/29/2016 FINAL 06/27/2016 1231      Labs: Results for orders placed or performed during the hospital encounter of 06/27/16 (from the past 48 hour(s))  Creatinine, serum     Status: None   Collection Time: 07/04/16  4:29 AM  Result Value Ref Range   Creatinine, Ser 0.77 0.61 - 1.24 mg/dL   GFR calc non Af Amer >60 >60 mL/min   GFR calc Af Amer >60 >60 mL/min    Comment: (NOTE) The eGFR has been calculated using the CKD EPI equation. This calculation has not been validated in all clinical situations. eGFR's persistently <60 mL/min signify possible Chronic Kidney Disease.   CBC     Status: Abnormal   Collection Time: 07/05/16  4:43 AM  Result Value Ref Range   WBC 10.9 (H) 4.0 - 10.5 K/uL   RBC 5.24 4.22 - 5.81 MIL/uL   Hemoglobin 15.4 13.0 - 17.0 g/dL   HCT 43.3 39.0 - 52.0 %   MCV 82.6 78.0 - 100.0 fL   MCH 29.4 26.0 - 34.0 pg   MCHC 35.6 30.0 - 36.0 g/dL   RDW 12.1 11.5 - 15.5 %   Platelets 222 150 - 400 K/uL  Comprehensive metabolic panel     Status: None   Collection Time: 07/05/16  4:43 AM  Result Value Ref Range   Sodium 140 135 - 145 mmol/L   Potassium 4.0 3.5 - 5.1 mmol/L   Chloride 103 101 - 111 mmol/L   CO2 30 22 - 32 mmol/L   Glucose, Bld 88 65 - 99 mg/dL   BUN 14 6 - 20 mg/dL   Creatinine, Ser 0.98 0.61 - 1.24 mg/dL  Calcium 9.4 8.9 - 10.3 mg/dL   Total Protein 6.9 6.5 - 8.1 g/dL   Albumin 3.9 3.5 - 5.0 g/dL   AST 28 15 - 41 U/L   ALT 46 17 - 63 U/L   Alkaline  Phosphatase 65 38 - 126 U/L   Total Bilirubin 0.7 0.3 - 1.2 mg/dL   GFR calc non Af Amer >60 >60 mL/min   GFR calc Af Amer >60 >60 mL/min    Comment: (NOTE) The eGFR has been calculated using the CKD EPI equation. This calculation has not been validated in all clinical situations. eGFR's persistently <60 mL/min signify possible Chronic Kidney Disease.    Anion gap 7 5 - 15     Lipid Panel  No results found for: CHOL, TRIG, HDL, CHOLHDL, VLDL, LDLCALC, LDLDIRECT   No results found for: HGBA1C   Lab Results  Component Value Date   CREATININE 0.98 07/05/2016     HPI :  HPI: Nathan Austinis a 20 y.o.malewith medical history significant of seizures and autism with behavioral disturbances. History obtained from chart and caregiver at bedside as patient is non verbal at baseline. Patient does walk and feed self normally. Patient has been having seizures more frequently in last few months. After his seizures, patient tends to Rub fingers together for at least 1 hour. Caregiver has noticed him doing this more than usual. He has also seen patient chewing on fingers. Patient was seen on 9/8 in the ER and noticed to have abrasions to fingers at that time. He was given a tenatuns shot and wound care. He comes back to the ER with fever and worsening infection.   ED Course:In the ER, he was given tylenol for fever. ER doc did small I/D of area with purulent material and this was sent for culture. Ortho was consulted and patient was made NPO for now. Given vanc/zosyn. Transitioned to cefazolin IV 9/15.    HOSPITAL COURSE:        Cellulitis of hands (no osteo on x ray) - clinically improving, WBC trending down, still with edema and heat and redness (less than on admission) -History of patient chewing and picking at fingers -small I/D at bedside by ER doc--cultures growing pansensitive MSSA and proteus - given cefazolin 1 gm IV Q8h, patient now transition to oral Bactrim,  will continue for 10 days -Dr. Jean Rosenthal following, continue local wound care as above, follow-up with him in 2 weeks right index finger  still needs local wound care daily with a small amount of xeroform or bactroban ointment daily followed by dry dressings   Acute Diarrhea - symptoms have resolved.    Uncontrolled seizures -follows with Dr. Gaynell Face, continue seizure precautions -no recent medication changes -no seizure activity so far observed in hospital   Autism with behavioral disturbances -chews on fingers as well as rubs fingers together     Discharge Exam:  Blood pressure 135/70, pulse 80, temperature 97.8 F (36.6 C), temperature source Axillary, resp. rate 18, height 6' (1.829 m), weight 81.6 kg (180 lb), SpO2 98 %.  Gen: Pt nonverbal but no apparent distress. Cooperative.   Eyes:PERRL, lids and conjunctivae normal Neck:normal, supple, no masses, no thyromegaly Respiratory:clear to auscultation bilaterally, no wheezing, no crackles. Normal respiratory effort. No accessory muscle use.  Cardiovascular:tachy, no murmurs / rubs / gallops. No extremity edema. 2+ pedal pulses. No carotid bruits.  Abdomen:no tenderness, no masses palpated. No hepatosplenomegaly. Bowel sounds positive.  Musculoskeletal:no clubbing / cyanosis. No joint deformity upper  and lower extremities. Good ROM, no contractures. Normal muscle tone.  Skin:acne on face--right hand and finger cellulitis much improved, less redness, still with edema and heat but overall improved. Neurologic:No focal abnormalities.  Psychiatric:Non-verbal at baseline    Follow-up [place], MD. Schedule an appointment as soon as possible for a visit in 2 week(s).   Specialty:  Orthopedic Surgery Contact information: [place] [address] [phone]        Benito Mccreedy, MD. Schedule an appointment as soon as possible for a visit in 2  day(s).   Specialty:  Internal Medicine Why:  hospital follow up Contact information: [address] [place] [address] [phone]           Signed: Reyne Dumas 07/05/2016, 8:51 AM        Time spent >45 mins

## 2016-07-16 ENCOUNTER — Telehealth (INDEPENDENT_AMBULATORY_CARE_PROVIDER_SITE_OTHER): Payer: Self-pay

## 2016-07-16 ENCOUNTER — Encounter (HOSPITAL_COMMUNITY): Payer: Self-pay | Admitting: Emergency Medicine

## 2016-07-16 ENCOUNTER — Emergency Department (HOSPITAL_COMMUNITY)
Admission: EM | Admit: 2016-07-16 | Discharge: 2016-07-16 | Disposition: A | Discharge status: Home / Self Care | Payer: Medicaid Other | Attending: Emergency Medicine | Admitting: Emergency Medicine

## 2016-07-16 DIAGNOSIS — F840 Autistic disorder: Secondary | ICD-10-CM | POA: Insufficient documentation

## 2016-07-16 DIAGNOSIS — G40909 Epilepsy, unspecified, not intractable, without status epilepticus: Secondary | ICD-10-CM | POA: Insufficient documentation

## 2016-07-16 DIAGNOSIS — R569 Unspecified convulsions: Secondary | ICD-10-CM

## 2016-07-16 DIAGNOSIS — G40209 Localization-related (focal) (partial) symptomatic epilepsy and epileptic syndromes with complex partial seizures, not intractable, without status epilepticus: Secondary | ICD-10-CM

## 2016-07-16 MED ORDER — SODIUM CHLORIDE 0.9 % IV SOLN
1000.0000 mg | Freq: Once | INTRAVENOUS | Status: AC
  Administered 2016-07-16: 1000 mg via INTRAVENOUS
  Filled 2016-07-16: qty 10

## 2016-07-16 MED ORDER — CHLORPROMAZINE HCL 25 MG PO TABS
100.0000 mg | ORAL_TABLET | Freq: Once | ORAL | Status: AC
  Administered 2016-07-16: 100 mg via ORAL
  Filled 2016-07-16: qty 4

## 2016-07-16 MED ORDER — LORAZEPAM 2 MG/ML IJ SOLN
1.0000 mg | Freq: Once | INTRAMUSCULAR | Status: AC
  Administered 2016-07-16: 1 mg via INTRAVENOUS

## 2016-07-16 MED ORDER — LEVETIRACETAM 500 MG PO TABS: ORAL_TABLET | ORAL | 2 refills | Status: DC

## 2016-07-16 MED ORDER — LORAZEPAM 2 MG/ML IJ SOLN
INTRAMUSCULAR | Status: DC
Start: 2016-07-16 — End: 2016-07-16
  Filled 2016-07-16: qty 1

## 2016-07-16 NOTE — Telephone Encounter (Signed)
Con MemosAlex Young called in to inform us that Nathan Moore has been admitted back in the hospital. He is requesting a call back.   CB:[phone]

## 2016-07-16 NOTE — ED Notes (Signed)
Bed: RESB Expected date:  Expected time:  Means of arrival:  Comments: 20 yo M, seizures

## 2016-07-16 NOTE — Telephone Encounter (Signed)
I spoke with Prudence DavidsonAlex Jones.  The patient is apparently had 4 seizures is going to be admitted.  He's been compliant with medication.  The question is whether or not to add another medication.  He has been on higher doses of levetiracetam which is why Onfi was added.  Medications which could be given to him include divalproex or phenytoin.  We not been able to get an EEG on him because of his autism.  Perhaps this can be done when he is postictal but I don't know how helpful it will be.  I spoke with my partner Dr. Devonne DoughtyNabizadeh to alert him to this fact.  I don't have any definite recommendations to make.  We may need to get some help from the adult epilepsy service concerning next steps.

## 2016-07-16 NOTE — ED Notes (Signed)
Pt continues to be agitated. Has pulled off dressings mulitple times.

## 2016-07-16 NOTE — ED Notes (Signed)
MD at bedside. 

## 2016-07-16 NOTE — ED Provider Notes (Signed)
[facility]-EMERGENCY DEPT Provider Note   CSN: [id number] Arrival date & time: 07/16/16  1358     History   Chief Complaint Chief Complaint  Patient presents with  . Seizures   Level V caveat: Nonverbal  HPI Nathan Moore is a 20 y.o. male.  HPI Patient has a history of seizures and Seizures.  He Is on [employer] Twice a Day of Keppra.  He Is Brought to the Emergency Department Today from His Group Home after He Was Noted to Have a Seizure.  No recent illness or fevers per the group home.  Patient baseline otherwise per group home leader.  Patient is nonverbal at baseline and unable to provide any additional information.   Past Medical History:  Diagnosis Date  . Absence seizures, intractable (HCC)   . Aggressive behavior   . Autism disorder   . Chronic static encephalopathy   . Seizures [facility])     Patient Active Problem List   Diagnosis Date Noted  . Cellulitis and abscess of hand 06/27/2016  . Abnormal involuntary movement 05/24/2016  . Autism spectrum disorder with accompanying language impairment, requiring very substantial support (level 3) 10/20/2015  . 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 Partial epilepsy with impairment of consciousness (HCC) 10/20/2015  . Epilepsy (HCC) 08/31/2015  . Behavior disturbance 08/31/2015  . Autism 08/31/2015  . MR (mental retardation) 08/31/2015    History reviewed. No pertinent surgical history.     Home Medications    Prior to Admission medications   Medication Sig Start Date End Date Taking? Authorizing Provider  cetirizine (ZYRTEC) 10 MG tablet Take 10 mg by mouth daily.   Yes Historical Provider, MD  chlorproMAZINE (THORAZINE) 100 MG tablet Take 100-200 mg by mouth 2 (two) times daily. Take 100mg  in the morning and then 200mg  at night   Yes Historical Provider, MD  doxycycline (VIBRA-TABS) 100 MG tablet Take 100 mg by mouth 2 (two) times daily. 05/28/16  Yes Historical Provider, MD  ONFI 10 MG tablet TAKE  ONE AND A HALF TABLETS BY MOUTH 2 TIMES DAILY Patient taking differently: TAKE ONE AND A HALF TABLETS= 15mg  BY MOUTH 2 TIMES DAILY 06/05/16  Yes Elveria Rising, NP  propranolol (INDERAL) 80 MG tablet Take 80 mg by mouth 3 (three) times daily. 07/06/15  Yes Historical Provider, MD  saccharomyces boulardii (FLORASTOR) 250 MG capsule Take 1 capsule (250 mg total) by mouth 2 (two) times daily. 07/05/16  Yes Richarda Overlie, MD  sulfamethoxazole-trimethoprim (BACTRIM DS,SEPTRA DS) 800-160 MG tablet Take 1 tablet by mouth every 12 (twelve) hours. 07/05/16 07/17/16 Yes Richarda Overlie, MD  chlorproMAZINE (THORAZINE) 50 MG tablet TAKE 1 TABLET BY MOUTH EVERY 4 HOURS AS NEEDED FOR AGITATION 10/03/15   Historical Provider, MD  levETIRAcetam (KEPPRA) 500 MG tablet Take 3 tablets twice per day 07/16/16   Azalia Bilis, MD    Family History History reviewed. No pertinent family history.  Social History Social History  Substance Use Topics  . Smoking status: Never Smoker  . Smokeless tobacco: Never Used  . Alcohol use No     Allergies   Review of patient's allergies indicates no known allergies.   Review of Systems Review of Systems  Unable to perform ROS: Patient nonverbal     Physical Exam Updated Vital Signs BP 121/62 (BP Location: Left Arm)   Pulse (!) 57   Temp 98.1 F (36.7 C)   Resp 20   SpO2 100%   Physical Exam  Constitutional: He appears well-developed and well-nourished.  HENT:  Head: Normocephalic and atraumatic.  Eyes: EOM are normal.  Neck: Normal range of motion.  Cardiovascular: Normal rate and regular rhythm.   Pulmonary/Chest: Effort normal and breath sounds normal. No respiratory distress.  Abdominal: Soft. He exhibits no distension. There is no tenderness.  Musculoskeletal:  Bilateral hands in protective mittens  Neurological:  Alert.  No obvious seizure activity  Skin: Skin is warm and dry.  Psychiatric: He has a normal mood and affect. Judgment normal.  Nursing  note and vitals reviewed.    ED Treatments / Results  Labs (all labs ordered are listed, but only abnormal results are displayed) Labs Reviewed  CBC  BASIC METABOLIC PANEL    EKG  EKG Interpretation None       Radiology No results found.  Procedures Procedures (including critical care time)  Medications Ordered in ED Medications  chlorproMAZINE (THORAZINE) tablet 100 mg (not administered)  levETIRAcetam (KEPPRA) 1,000 mg in sodium chloride 0.9 % 100 mL IVPB (0 mg Intravenous Stopped 07/16/16 1544)  LORazepam (ATIVAN) injection 1 mg (1 mg Intravenous Given 07/16/16 1512)  LORazepam (ATIVAN) injection 1 mg (1 mg Intravenous Given 07/16/16 1517)      Initial Impression / Assessment and Plan / ED Course  I have reviewed the triage vital signs and the nursing notes.  Pertinent labs & imaging results that were available during my care of the patient were reviewed by me and considered in my medical decision making (see chart for details).  Clinical Course    1 seizure while here in the emergency department.  Resolved with Ativan.  Patient given 1000 mg IV Keppra bolus.  I'll increase the patient's Keppra from thousand twice a day to 1500 twice a day.  He will need to follow-up with his pediatric neurologist for additional evaluation and recommendations.  He may be a candidate for second antiepileptic medication.  This was conveyed to the group home leader who is agreeable take the patient home at this time.  Final Clinical Impressions(s) / ED Diagnoses   Final diagnoses:  Seizure [facility](HCC)    New Prescriptions Current Discharge Medication List       Azalia BilisKevin Kierstan Auer, MD 07/16/16 1810

## 2016-07-16 NOTE — ED Notes (Signed)
Unable to collect labs at this time because of patient behavior. 

## 2016-07-16 NOTE — ED Notes (Signed)
MD at bedside. EDP CAMPOS PRESENT TO SPEAK WITH GROUP HOME REPRESENTATIVE

## 2016-07-16 NOTE — ED Notes (Addendum)
MD at bedside. EDP CAMPOS PRESENT to access rt hand pointer finger abscess (chronic) seen by home health for this wound

## 2016-07-16 NOTE — ED Notes (Signed)
Seizure relieved after 2nd dose of ativan

## 2016-07-16 NOTE — ED Triage Notes (Addendum)
Per EMS. Pt from school after having a witnessed seizure. Pt nonverbal at baseline. Was being assisted in the restroom when he had a witnessed grand mal seizure on the toilet. Was assisted to floor by school staff. Hx of seizures, no mouth injury noted. Did have some incontinence noted. School staff mentioned to EMS that post-ictal state usually lasts an hour. Pt lives at a group home, and group home caregiver is on the way to the ED. Pt has mittens on hands due to hand biting while post-ictal.

## 2016-07-17 ENCOUNTER — Telehealth (INDEPENDENT_AMBULATORY_CARE_PROVIDER_SITE_OTHER): Payer: Self-pay

## 2016-07-17 NOTE — Telephone Encounter (Signed)
I called Nathan Moore and he said that he did not call asking about a wheelchair. He said that he wanted to know about a wound care doctor. He said that Nathanyl's hands and fingers had wounds that were worse than when he was last seen and even though he was being receiving visits from a home care nurse that some of the wounds had black places on them and Nathan Moore felt like the wounds needed more attention. [facility] and recommended that he start there. It is my understanding that no referral is needed. Nathan Moore agreed with this plan and had no further questions. TG

## 2016-07-17 NOTE — Telephone Encounter (Signed)
Nathan Moore called looking for recommendations for a wheelchair doctor. He is requesting a call back.   CB:[phone]

## 2016-07-18 NOTE — Telephone Encounter (Signed)
Noted  

## 2016-07-20 ENCOUNTER — Encounter: Payer: Medicaid Other | Attending: Nurse Practitioner | Admitting: Nurse Practitioner

## 2016-07-20 DIAGNOSIS — F840 Autistic disorder: Secondary | ICD-10-CM | POA: Insufficient documentation

## 2016-07-20 DIAGNOSIS — X58XXXA Exposure to other specified factors, initial encounter: Secondary | ICD-10-CM | POA: Insufficient documentation

## 2016-07-20 DIAGNOSIS — S61002A Unspecified open wound of left thumb without damage to nail, initial encounter: Secondary | ICD-10-CM | POA: Diagnosis not present

## 2016-07-20 DIAGNOSIS — F72 Severe intellectual disabilities: Secondary | ICD-10-CM | POA: Diagnosis not present

## 2016-07-20 DIAGNOSIS — G40909 Epilepsy, unspecified, not intractable, without status epilepticus: Secondary | ICD-10-CM | POA: Insufficient documentation

## 2016-07-20 DIAGNOSIS — S61300A Unspecified open wound of right index finger with damage to nail, initial encounter: Secondary | ICD-10-CM | POA: Insufficient documentation

## 2016-07-21 NOTE — Progress Notes (Addendum)
Nathan Moore, Nathan Moore ([id number]) Visit Report for 07/20/2016 Allergy List Details Patient Name: Nathan Moore, Nathan Moore Date of Service: 07/20/2016 9:45 AM Medical Record Number: [id number] Patient Account Number: [id number] Date of Birth/Sex: April 11, 1996 (20 y.o. Male) Treating RN: Curtis Sites Primary Care Physician: Jackie Plum Other Clinician: Referring Physician: Jackie Plum Treating Physician/Extender: Eugene Garnet in Treatment: 0 Allergies Active Allergies No Known Drug Allergies Allergy Notes Electronic Signature(s) Signed: 07/20/2016 5:08:51 PM By: Curtis Sites Entered By: Curtis Sites on 07/20/2016 09:57:29 Nathan Moore ([id number]) -------------------------------------------------------------------------------- Arrival Information Details Patient Name: Nathan Moore Date of Service: 07/20/2016 9:45 AM Medical Record Number: [id number] Patient Account Number: [id number] Date of Birth/Sex: 1996-07-15 (20 y.o. Male) Treating RN: Curtis Sites Primary Care Physician: Jackie Plum Other Clinician: Referring Physician: Jackie Plum Treating Physician/Extender: Eugene Garnet in Treatment: 0 Visit Information Patient Arrived: Ambulatory Arrival Time: 09:52 Accompanied By: staff Transfer Assistance: None Patient Identification Verified: Yes Secondary Verification Process Yes Completed: Electronic Signature(s) Signed: 07/20/2016 5:08:51 PM By: Curtis Sites Entered By: Curtis Sites on 07/20/2016 09:53:32 Nathan Moore ([id number]) -------------------------------------------------------------------------------- Clinic Level of Care Assessment Details Patient Name: Nathan Moore Date of Service: 07/20/2016 9:45 AM Medical Record Number: [id number] Patient Account Number: [id number] Date of Birth/Sex: 1995/11/23 (20 y.o. Male) Treating RN: Curtis Sites Primary Care Physician: Jackie Plum Other Clinician: Referring  Physician: Jackie Plum Treating Physician/Extender: Eugene Garnet in Treatment: 0 Clinic Level of Care Assessment Items TOOL 2 Quantity Score []  - Use when only an EandM is performed on the INITIAL visit 0 ASSESSMENTS - Nursing Assessment / Reassessment X - General Physical Exam (combine w/ comprehensive assessment (listed just 1 20 below) when performed on new pt. evals) X - Comprehensive Assessment (HX, ROS, Risk Assessments, Wounds Hx, etc.) 1 25 ASSESSMENTS - Wound and Skin Assessment / Reassessment []  - Simple Wound Assessment / Reassessment - one wound 0 X - Complex Wound Assessment / Reassessment - multiple wounds 2 5 []  - Dermatologic / Skin Assessment (not related to wound area) 0 ASSESSMENTS - Ostomy and/or Continence Assessment and Care []  - Incontinence Assessment and Management 0 []  - Ostomy Care Assessment and Management (repouching, etc.) 0 PROCESS - Coordination of Care X - Simple Patient / Family Education for ongoing care 1 15 []  - Complex (extensive) Patient / Family Education for ongoing care 0 []  - Staff obtains Chiropractor, Records, Test Results / Process Orders 0 []  - Staff telephones HHA, Nursing Homes / Clarify orders / etc 0 []  - Routine Transfer to another Facility (non-emergent condition) 0 []  - Routine Hospital Admission (non-emergent condition) 0 X - New Admissions / Manufacturing engineer / Ordering NPWT, Apligraf, etc. 1 15 []  - Emergency Hospital Admission (emergent condition) 0 X - Simple Discharge Coordination 1 10 Nathan Moore, Nathan Moore ([id number]) []  - Complex (extensive) Discharge Coordination 0 PROCESS - Special Needs []  - Pediatric / Minor Patient Management 0 []  - Isolation Patient Management 0 []  - Hearing / Language / Visual special needs 0 []  - Assessment of Community assistance (transportation, D/C planning, etc.) 0 []  - Additional assistance / Altered mentation 0 []  - Support Surface(s) Assessment (bed, cushion, seat, etc.)  0 INTERVENTIONS - Wound Cleansing / Measurement X - Wound Imaging (photographs - any number of wounds) 1 5 []  - Wound Tracing (instead of photographs) 0 X - Simple Wound Measurement - one wound 1 5 []  - Complex Wound Measurement - multiple wounds 0 X - Simple Wound Cleansing - one wound 1 5 []  - Complex Wound Cleansing - multiple wounds 0  INTERVENTIONS - Wound Dressings []  - Small Wound Dressing one or multiple wounds 0 X - Medium Wound Dressing one or multiple wounds 1 15 []  - Large Wound Dressing one or multiple wounds 0 []  - Application of Medications - injection 0 INTERVENTIONS - Miscellaneous []  - External ear exam 0 []  - Specimen Collection (cultures, biopsies, blood, body fluids, etc.) 0 []  - Specimen(s) / Culture(s) sent or taken to Lab for analysis 0 []  - Patient Transfer (multiple staff / Michiel Sites Lift / Similar devices) 0 []  - Simple Staple / Suture removal (25 or less) 0 []  - Complex Staple / Suture removal (26 or more) 0 Nathan Moore, Nathan Moore ([id number]) []  - Hypo / Hyperglycemic Management (close monitor of Blood Glucose) 0 []  - Ankle / Brachial Index (ABI) - do not check if billed separately 0 Has the patient been seen at the hospital within the last three years: Yes Total Score: 125 Level Of Care: New/Established - Level 4 Electronic Signature(s) Signed: 07/20/2016 5:08:51 PM By: Curtis Sites Entered By: Curtis Sites on 07/20/2016 12:04:46 Nathan Moore ([id number]) -------------------------------------------------------------------------------- Encounter Discharge Information Details Patient Name: Nathan Moore Date of Service: 07/20/2016 9:45 AM Medical Record Number: [id number] Patient Account Number: [id number] Date of Birth/Sex: [birth year] (20 y.o. Male) Treating RN: Curtis Sites Primary Care Physician: Jackie Plum Other Clinician: Referring Physician: Jackie Plum Treating Physician/Extender: Eugene Garnet in Treatment: 0 Encounter  Discharge Information Items Discharge Pain Level: 0 Discharge Condition: Stable Ambulatory Status: Ambulatory Discharge Destination: Home Transportation: Private Auto Accompanied By: Shelle Iron Schedule Follow-up Appointment: Yes Medication Reconciliation completed and provided to Patient/Care No Nathan Moore: Provided on Clinical Summary of Care: 07/20/2016 Form Type Recipient Paper Patient at Electronic Signature(s) Signed: 07/20/2016 12:06:16 PM By: Curtis Sites Entered By: Curtis Sites on 07/20/2016 12:06:16 Nathan Moore ([id number]) -------------------------------------------------------------------------------- Multi Wound Chart Details Patient Name: Nathan Moore Date of Service: 07/20/2016 9:45 AM Medical Record Number: [id number] Patient Account Number: [id number] Date of Birth/Sex: June 06, 1996 (20 y.o. Male) Treating RN: Curtis Sites Primary Care Physician: Jackie Plum Other Clinician: Referring Physician: Jackie Plum Treating Physician/Extender: Eugene Garnet in Treatment: 0 Vital Signs Height(in): 70 Pulse(bpm): 79 Weight(lbs): 177 Blood Pressure 123/66 (mmHg): Body Mass Index(BMI): 25 Temperature(F): Respiratory Rate 16 (breaths/min): Photos: [1:No Photos] [2:No Photos] [N/A:N/A] Wound Location: [1:Right Hand - 2nd Digit] [2:Left Hand - 1st Digit] [N/A:N/A] Wounding Event: [1:Trauma] [2:Trauma] [N/A:N/A] Primary Etiology: [1:Pressure Ulcer] [2:Pressure Ulcer] [N/A:N/A] Secondary Etiology: [1:Trauma, Other] [2:Trauma, Other] [N/A:N/A] Comorbid History: [1:Seizure Disorder] [2:Seizure Disorder] [N/A:N/A] Date Acquired: [1:06/27/2016] [2:06/27/2016] [N/A:N/A] Weeks of Treatment: [1:0] [2:0] [N/A:N/A] Wound Status: [1:Open] [2:Open] [N/A:N/A] Pending Amputation on Yes [2:Yes] [N/A:N/A] Presentation: Measurements L x W x D 5.3x7.5x0.1 [2:1x1x0.1] [N/A:N/A] (cm) Area (cm) : [1:31.22] [2:0.785] [N/A:N/A] Volume (cm) : [1:3.122]  [2:0.079] [N/A:N/A] Classification: [1:Category/Stage III] [2:Category/Stage II] [N/A:N/A] Exudate Amount: [1:Medium] [2:Medium] [N/A:N/A] Exudate Type: [1:Sanguinous] [2:Serous] [N/A:N/A] Exudate Color: [1:red] [2:amber] [N/A:N/A] Wound Margin: [1:Flat and Intact] [2:Flat and Intact] [N/A:N/A] Granulation Amount: [1:Small (1-33%)] [2:Large (67-100%)] [N/A:N/A] Granulation Quality: [1:Red] [2:Red, Pink] [N/A:N/A] Necrotic Amount: [1:Large (67-100%)] [2:None Present (0%)] [N/A:N/A] Necrotic Tissue: [1:Eschar, Adherent Slough] [2:N/A] [N/A:N/A] Exposed Structures: [1:Fascia: No Fat: No Tendon: No Muscle: No Joint: No Bone: No] [2:Fascia: No Fat: No Tendon: No Muscle: No Joint: No Bone: No] [N/A:N/A] Limited to Skin Limited to Skin Breakdown Breakdown Epithelialization: None Small (1-33%) N/A Periwound Skin Texture: Edema: Yes Edema: No N/A Excoriation: No Excoriation: No Induration: No Induration: No Callus: No Callus: No Crepitus: No Crepitus: No Fluctuance: No Fluctuance: No Friable: No Friable:  No Rash: No Rash: No Scarring: No Scarring: No Periwound Skin Maceration: Yes Dry/Scaly: Yes N/A Moisture: Moist: Yes Maceration: No Dry/Scaly: No Moist: No Periwound Skin Color: Erythema: Yes Atrophie Blanche: No N/A Atrophie Blanche: No Cyanosis: No Cyanosis: No Ecchymosis: No Ecchymosis: No Erythema: No Hemosiderin Staining: No Hemosiderin Staining: No Mottled: No Mottled: No Pallor: No Pallor: No Rubor: No Rubor: No Erythema Location: Circumferential N/A N/A Tenderness on Yes Yes N/A Palpation: Wound Preparation: Ulcer Cleansing: Ulcer Cleansing: N/A Rinsed/Irrigated with Rinsed/Irrigated with Saline Saline Topical Anesthetic Topical Anesthetic Applied: Other: lidocaine Applied: None 4% Treatment Notes Electronic Signature(s) Signed: 07/20/2016 5:08:51 PM By: Curtis Sites Entered By: Curtis Sites on 07/20/2016 10:28:25 Nathan Moore  ([id number]) -------------------------------------------------------------------------------- Multi-Disciplinary Care Plan Details Patient Name: Nathan Moore Date of Service: 07/20/2016 9:45 AM Medical Record Number: [id number] Patient Account Number: [id number] Date of Birth/Sex: [birth year] (20 y.o. Male) Treating RN: Curtis Sites Primary Care Physician: Jackie Plum Other Clinician: Referring Physician: Jackie Plum Treating Physician/Extender: Eugene Garnet in Treatment: 0 Active Inactive Electronic Signature(s) Signed: 08/01/2016 11:28:16 AM By: Elliot Gurney RN, BSN, Kim RN, BSN Signed: 09/17/2016 5:33:51 PM By: Curtis Sites Previous Signature: 07/20/2016 5:08:51 PM Version By: Curtis Sites Entered By: Elliot Gurney RN, BSN, Kim on 08/01/2016 11:28:15 Nathan Moore ([id number]) -------------------------------------------------------------------------------- Pain Assessment Details Patient Name: Nathan Moore Date of Service: 07/20/2016 9:45 AM Medical Record Number: [id number] Patient Account Number: [id number] Date of Birth/Sex: 05-19-1996 (20 y.o. Male) Treating RN: Curtis Sites Primary Care Physician: Jackie Plum Other Clinician: Referring Physician: Jackie Plum Treating Physician/Extender: Eugene Garnet in Treatment: 0 Active Problems Location of Pain Severity and Description of Pain Patient Has Paino Patient Unable to Respond Site Locations Pain Management and Medication Current Pain Management: Notes Topical or injectable lidocaine is offered to patient for acute pain when surgical debridement is performed. If needed, Patient is instructed to use over the counter pain medication for the following 24-48 hours after debridement. Wound care MDs do not prescribed pain medications. Patient has chronic pain or uncontrolled pain. Patient has been instructed to make an appointment with their Primary Care Physician for pain  management. Electronic Signature(s) Signed: 07/20/2016 5:08:51 PM By: Curtis Sites Entered By: Curtis Sites on 07/20/2016 09:54:25 Nathan Moore ([id number]) -------------------------------------------------------------------------------- Patient/Caregiver Education Details Patient Name: Nathan Moore Date of Service: 07/20/2016 9:45 AM Medical Record Number: [id number] Patient Account Number: [id number] Date of Birth/Gender: [birth year] (20 y.o. Male) Treating RN: Curtis Sites Primary Care Physician: Jackie Plum Other Clinician: Referring Physician: Jackie Plum Treating Physician/Extender: Eugene Garnet in Treatment: 0 Education Assessment Education Provided To: Caregiver Education Topics Provided Wound/Skin Impairment: Handouts: Other: wound care as ordered Methods: Demonstration, Explain/Verbal Responses: State content correctly Electronic Signature(s) Signed: 07/20/2016 5:08:51 PM By: Curtis Sites Entered By: Curtis Sites on 07/20/2016 12:06:36 Nathan Moore ([id number]) -------------------------------------------------------------------------------- Wound Assessment Details Patient Name: Nathan Moore Date of Service: 07/20/2016 9:45 AM Medical Record Number: [id number] Patient Account Number: [id number] Date of Birth/Sex: 09/18/1996 (20 y.o. Male) Treating RN: Curtis Sites Primary Care Physician: Jackie Plum Other Clinician: Referring Physician: Jackie Plum Treating Physician/Extender: Eugene Garnet in Treatment: 0 Wound Status Wound Number: 1 Primary Etiology: Pressure Ulcer Wound Location: Right Hand - 2nd Digit Secondary Etiology: Trauma, Other Wounding Event: Trauma Wound Status: Open Date Acquired: 06/27/2016 Comorbid History: Seizure Disorder Weeks Of Treatment: 0 Clustered Wound: No Pending Amputation On Presentation Photos Wound Measurements Length: (cm) 5.3 Width: (cm) 7.5 Depth: (cm)  0.1 Area: (cm) 31.22 Volume: (cm) 3.122 % Reduction in Area: 0% % Reduction in  Volume: 0% Epithelialization: None Tunneling: No Undermining: No Wound Description Classification: Category/Stage III Foul Odor Afte Wound Margin: Flat and Intact Exudate Amount: Medium Exudate Type: Sanguinous Exudate Color: red r Cleansing: No Wound Bed Granulation Amount: Small (1-33%) Exposed Structure Granulation Quality: Red Fascia Exposed: No Necrotic Amount: Large (67-100%) Fat Layer Exposed: No Necrotic Quality: Eschar, Adherent Slough Tendon Exposed: No Nathan Moore, Nathan Moore ([id number]) Muscle Exposed: No Joint Exposed: No Bone Exposed: No Limited to Skin Breakdown Periwound Skin Texture Texture Color No Abnormalities Noted: No No Abnormalities Noted: No Callus: No Atrophie Blanche: No Crepitus: No Cyanosis: No Excoriation: No Ecchymosis: No Fluctuance: No Erythema: Yes Friable: No Erythema Location: Circumferential Induration: No Hemosiderin Staining: No Localized Edema: Yes Mottled: No Rash: No Pallor: No Scarring: No Rubor: No Moisture Temperature / Pain No Abnormalities Noted: No Tenderness on Palpation: Yes Dry / Scaly: No Maceration: Yes Moist: Yes Wound Preparation Ulcer Cleansing: Rinsed/Irrigated with Saline Topical Anesthetic Applied: Other: lidocaine 4%, Electronic Signature(s) Signed: 07/23/2016 2:27:00 PM By: Curtis Sitesorthy, Joanna Previous Signature: 07/20/2016 5:08:51 PM Version By: Curtis Sitesorthy, Joanna Entered By: Curtis Sitesorthy, Joanna on 07/23/2016 10:02:56 Nathan Moore, Nathan Moore ([id number]) -------------------------------------------------------------------------------- Wound Assessment Details Patient Name: Nathan Moore, Nathan Moore Date of Service: 07/20/2016 9:45 AM Medical Record Number: [id number] Patient Account Number: [id number] Date of Birth/Sex: [birth year] (20 y.o. Male) Treating RN: Curtis Sitesorthy, Joanna Primary Care Physician: Jackie PlumSEI-BONSU, GEORGE Other Clinician: Referring  Physician: Jackie PlumSEI-BONSU, GEORGE Treating Physician/Extender: Eugene GarnetSaunders, Sharon Weeks in Treatment: 0 Wound Status Wound Number: 2 Primary Etiology: Pressure Ulcer Wound Location: Left Hand - 1st Digit Secondary Etiology: Trauma, Other Wounding Event: Trauma Wound Status: Open Date Acquired: 06/27/2016 Comorbid History: Seizure Disorder Weeks Of Treatment: 0 Clustered Wound: No Pending Amputation On Presentation Photos Wound Measurements Length: (cm) 1 Width: (cm) 1 Depth: (cm) 0.1 Area: (cm) 0.785 Volume: (cm) 0.079 % Reduction in Area: 0% % Reduction in Volume: 0% Epithelialization: Small (1-33%) Tunneling: No Undermining: No Wound Description Classification: Category/Stage II Foul Odor Afte Wound Margin: Flat and Intact Exudate Amount: Medium Exudate Type: Serous Exudate Color: amber r Cleansing: No Wound Bed Granulation Amount: Large (67-100%) Exposed Structure Granulation Quality: Red, Pink Fascia Exposed: No Necrotic Amount: None Present (0%) Fat Layer Exposed: No Tendon Exposed: No Nathan Moore, Nathan Moore ([id number]) Muscle Exposed: No Joint Exposed: No Bone Exposed: No Limited to Skin Breakdown Periwound Skin Texture Texture Color No Abnormalities Noted: No No Abnormalities Noted: No Callus: No Atrophie Blanche: No Crepitus: No Cyanosis: No Excoriation: No Ecchymosis: No Fluctuance: No Erythema: No Friable: No Hemosiderin Staining: No Induration: No Mottled: No Localized Edema: No Pallor: No Rash: No Rubor: No Scarring: No Temperature / Pain Moisture Tenderness on Palpation: Yes No Abnormalities Noted: No Dry / Scaly: Yes Maceration: No Moist: No Wound Preparation Ulcer Cleansing: Rinsed/Irrigated with Saline Topical Anesthetic Applied: None Electronic Signature(s) Signed: 07/23/2016 2:27:00 PM By: Curtis Sitesorthy, Joanna Previous Signature: 07/20/2016 5:08:51 PM Version By: Curtis Sitesorthy, Joanna Entered By: Curtis Sitesorthy, Joanna on 07/23/2016 10:03:27 Nathan Moore,  Nathan Moore ([id number]) -------------------------------------------------------------------------------- Vitals Details Patient Name: Nathan Moore, Nathan Moore Date of Service: 07/20/2016 9:45 AM Medical Record Number: [id number] Patient Account Number: [id number] Date of Birth/Sex: [birth year] (20 y.o. Male) Treating RN: Curtis Sitesorthy, Joanna Primary Care Physician: Jackie PlumSEI-BONSU, GEORGE Other Clinician: Referring Physician: Jackie PlumSEI-BONSU, GEORGE Treating Physician/Extender: Eugene GarnetSaunders, Sharon Weeks in Treatment: 0 Vital Signs Time Taken: 09:54 Pulse (bpm): 79 Height (in): 70 Respiratory Rate (breaths/min): 16 Source: Stated Blood Pressure (mmHg): 123/66 Weight (lbs): 177 Reference Range: 80 - 120 mg / dl Source: Stated Body Mass Index (BMI): 25.4 Electronic Signature(s) Signed: 07/20/2016 5:08:51 PM By:  Dorthy, Mardene Celeste Entered By: Curtis Sites on 07/20/2016 09:57:12

## 2016-07-21 NOTE — Progress Notes (Signed)
ALYAAN, BUDZYNSKI ([id number]) Visit Report for 07/20/2016 Abuse/Suicide Risk Screen Details Patient Name: Nathan Moore, Nathan Moore Date of Service: 07/20/2016 9:45 AM Medical Record Number: [id number] Patient Account Number: [id number] Date of Birth/Sex: [birth year] (20 y.o. Male) Treating RN: Curtis Sites Primary Care Physician: Jackie Plum Other Clinician: Referring Physician: Jackie Plum Treating Physician/Extender: Eugene Garnet in Treatment: 0 Abuse/Suicide Risk Screen Items Answer ABUSE/SUICIDE RISK SCREEN: Has anyone close to you tried to hurt or harm you recentlyo No Do you feel uncomfortable with anyone in your familyo No Has anyone forced you do things that you didnot want to doo No Do you have any thoughts of harming yourselfo No Patient displays signs or symptoms of abuse and/or neglect. No Electronic Signature(s) Signed: 07/20/2016 5:08:51 PM By: Curtis Sites Entered By: Curtis Sites on 07/20/2016 10:05:45 Sofie Rower ([id number]) -------------------------------------------------------------------------------- Activities of Daily Living Details Patient Name: GEOVONNI, MEYERHOFF Date of Service: 07/20/2016 9:45 AM Medical Record Number: [id number] Patient Account Number: [id number] Date of Birth/Sex: Jun 15, 1996 (20 y.o. Male) Treating RN: Curtis Sites Primary Care Physician: Jackie Plum Other Clinician: Referring Physician: Jackie Plum Treating Physician/Extender: Eugene Garnet in Treatment: 0 Activities of Daily Living Items Answer Activities of Daily Living (Please select one for each item) Drive Automobile Not Able Take Medications Need Assistance Use Telephone Not Able Care for Appearance Need Assistance Use Toilet Need Assistance Bath / Shower Need Assistance Dress Self Need Assistance Feed Self Completely Able Walk Completely Able Get In / Out Bed Completely Able Housework Not Able Prepare Meals Not Able Handle  Money Not Able Shop for Self Need Assistance Electronic Signature(s) Signed: 07/20/2016 5:08:51 PM By: Curtis Sites Entered By: Curtis Sites on 07/20/2016 10:06:21 Sofie Rower ([id number]) -------------------------------------------------------------------------------- Education Assessment Details Patient Name: Sofie Rower Date of Service: 07/20/2016 9:45 AM Medical Record Number: [id number] Patient Account Number: [id number] Date of Birth/Sex: 1996/01/06 (20 y.o. Male) Treating RN: Curtis Sites Primary Care Physician: Jackie Plum Other Clinician: Referring Physician: Jackie Plum Treating Physician/Extender: Eugene Garnet in Treatment: 0 Primary Learner Assessed: Caregiver Reason Patient is not Primary Learner: mental status Learning Preferences/Education Level/Primary Language Learning Preference: Explanation, Demonstration, Printed Material Highest Education Level: College or Above Preferred Language: English Cognitive Barrier Assessment/Beliefs Language Barrier: No Translator Needed: No Memory Deficit: No Emotional Barrier: No Cultural/Religious Beliefs Affecting Medical No Care: Physical Barrier Assessment Impaired Vision: No Impaired Hearing: No Decreased Hand dexterity: No Knowledge/Comprehension Assessment Knowledge Level: Medium Comprehension Level: Medium Ability to understand written Medium instructions: Ability to understand verbal Medium instructions: Motivation Assessment Anxiety Level: Calm Cooperation: Cooperative Education Importance: Acknowledges Need Interest in Health Problems: Asks Questions Perception: Coherent Willingness to Engage in Self- Medium Management Activities: Readiness to Engage in Self- Medium Management Activities: JANSON, LAMAR ([id number]) Electronic Signature(s) Signed: 07/20/2016 5:08:51 PM By: Curtis Sites Entered By: Curtis Sites on 07/20/2016 10:06:46 Sofie Rower  ([id number]) -------------------------------------------------------------------------------- Fall Risk Assessment Details Patient Name: Sofie Rower Date of Service: 07/20/2016 9:45 AM Medical Record Number: [id number] Patient Account Number: [id number] Date of Birth/Sex: Oct 23, 1995 (20 y.o. Male) Treating RN: Curtis Sites Primary Care Physician: Jackie Plum Other Clinician: Referring Physician: Jackie Plum Treating Physician/Extender: Eugene Garnet in Treatment: 0 Fall Risk Assessment Items Have you had 2 or more falls in the last 12 monthso 0 Yes Have you had any fall that resulted in injury in the last 12 monthso 0 No FALL RISK ASSESSMENT: History of falling - immediate or within 3 months 25 Yes Secondary diagnosis 0 No Ambulatory aid None/bed rest/wheelchair/nurse 0 Yes Crutches/cane/walker 0 No  Furniture 0 No IV Access/Saline Lock 0 No Gait/Training Normal/bed rest/immobile 0 Yes Weak 0 No Impaired 0 No Mental Status Oriented to own ability 0 Yes Electronic Signature(s) Signed: 07/20/2016 5:08:51 PM By: Curtis Sitesorthy, Joanna Entered By: Curtis Sitesorthy, Joanna on 07/20/2016 10:07:22 Sofie RowerAUSTIN, Arne ([id number]) -------------------------------------------------------------------------------- Nutrition Risk Assessment Details Patient Name: Sofie RowerAUSTIN, Yoniel Date of Service: 07/20/2016 9:45 AM Medical Record Number: [id number] Patient Account Number: [id number] Date of Birth/Sex: 1996-01-16 (20 y.o. Male) Treating RN: Curtis Sitesorthy, Joanna Primary Care Physician: Jackie PlumSEI-BONSU, GEORGE Other Clinician: Referring Physician: Jackie PlumSEI-BONSU, GEORGE Treating Physician/Extender: Eugene GarnetSaunders, Sharon Weeks in Treatment: 0 Height (in): 70 Weight (lbs): 177 Body Mass Index (BMI): 25.4 Nutrition Risk Assessment Items NUTRITION RISK SCREEN: I have an illness or condition that made me change the kind and/or 0 No amount of food I eat I eat fewer than two meals per day 0 No I eat few  fruits and vegetables, or milk products 0 No I have three or more drinks of beer, liquor or wine almost every day 0 No I have tooth or mouth problems that make it hard for me to eat 0 No I don't always have enough money to buy the food I need 0 No I eat alone most of the time 0 No I take three or more different prescribed or over-the-counter drugs a 1 Yes day Without wanting to, I have lost or gained 10 pounds in the last six 0 No months I am not always physically able to shop, cook and/or feed myself 0 No Nutrition Protocols Good Risk Protocol 0 No interventions needed Moderate Risk Protocol Electronic Signature(s) Signed: 07/20/2016 5:08:51 PM By: Curtis Sitesorthy, Joanna Entered By: Curtis Sitesorthy, Joanna on 07/20/2016 10:07:29

## 2016-07-21 NOTE — Progress Notes (Addendum)
DAELEN, BELVEDERE ([id number]) Visit Report for 07/20/2016 Chief Complaint Document Details Patient Name: Nathan Moore, Nathan Moore Date of Service: 07/20/2016 9:45 AM Medical Record Number: [id number] Patient Account Number: [id number] Date of Birth/Sex: [birth year] (20 y.o. Male) Treating RN: Phillis Haggis Primary Care Physician: Jackie Plum Other Clinician: Referring Physician: Jackie Plum Treating Physician/Extender: Eugene Garnet in Treatment: 0 Information Obtained from: Caregiver Chief Complaint New referral for a right index finger and left 1st finger wounds. Electronic Signature(s) Signed: 07/20/2016 4:21:25 PM By: Georges Lynch FNP Entered By: Georges Lynch on 07/20/2016 11:12:43 Nathan Moore ([id number]) -------------------------------------------------------------------------------- HPI Details Patient Name: Nathan Moore Date of Service: 07/20/2016 9:45 AM Medical Record Number: [id number] Patient Account Number: [id number] Date of Birth/Sex: [birth year] (20 y.o. Male) Treating RN: Phillis Haggis Primary Care Physician: Jackie Plum Other Clinician: Referring Physician: Jackie Plum Treating Physician/Extender: Eugene Garnet in Treatment: 0 History of Present Illness Location: right index and 3rd fingers Quality: He is unable to verbalize pain. he doesn't exhibit emotion today. Severity: unable to provide severity secondary to mental status and nonverbal status. Duration: approximately 06/27/16 Timing: unable to provide this info. Context: biting, chewing and rubbing his fingers together. caregiver reports rubbing occurs coming out of a seizure Modifying Factors: Bactroban, xeroform, constant trauma from self injury, caregiver reports he constantly removes dressings and bandages. Associated Signs and Symptoms: necrotic tip of right index finger, drainage HPI Description: Patient was referred today for evaluation and  management of wounds to the right index and middle fingers. he is nonverbal with hx of autism, moderate intellectual disability, epilepsy and mental retardation. He currently resides in a group home and is accompanied by a caretaker. I reviewed hospital clinical notes and imaging. Information is very limited. Review of Xray findings dated 06/27/16 showed marked soft tissue swelling of the right index finger and the third finger. there was no evidence for a foreign bodies or gas collections. no underlying bony changes that suggest osteomyelitis or fractures. Findings were compatible with cellulitis. Care giver, from group home, provides information for HPI. I also reviewed hospital EMR clinical notes. He was hospitalized in September for sepsis and cellulitis of right hand. He received IV abx and was converted to po abx. Wound culture was positive for MSSA. He was seen by Dr. Rayburn Ma, ortho, who performed debridement during his hospital stay. He was to f/u 2 weeks after d/c, but caregiver reports wasn't aware of this. His caregiver reports the wounds are worsening because pt removes dressings and chews and bites wounds and places fingers in his mouth. He is UTD on tetanus. Caregiver reports pt has tendencies for violent behavior. he is calm today. No reported fevers or body aches. Electronic Signature(s) Signed: 07/20/2016 4:21:25 PM By: Georges Lynch FNP Entered By: Georges Lynch on 07/20/2016 11:33:26 Nathan Moore, Nathan Moore ([id number]) -------------------------------------------------------------------------------- Physical Exam Details Patient Name: Nathan Moore Date of Service: 07/20/2016 9:45 AM Medical Record Number: [id number] Patient Account Number: [id number] Date of Birth/Sex: [birth year] (20 y.o. Male) Treating RN: Phillis Haggis Primary Care Physician: Jackie Plum Other Clinician: Referring Physician: Jackie Plum Treating Physician/Extender: Eugene Garnet  in Treatment: 0 Constitutional Patient's appearance is neat and clean. Appears in no acute distress. Well nourished and well developed.. Ears, Nose, Mouth, and Throat Patient can hear normal speaking tones without difficulty. makes eye contact when spoken too.Marland Kitchen Respiratory Respiratory effort is easy and symmetric bilaterally. Rate is normal at rest and on room air.Marland Kitchen Psychiatric poor secondary to mental status. alert. makes eye contact when name spoken.. no evidence of anxiey  or agitation. flat affect. cooperative.. Electronic Signature(s) Signed: 07/20/2016 4:21:25 PM By: Georges Lynch FNP Entered By: Georges Lynch on 07/20/2016 11:42:57 Nathan Moore ([id number]) -------------------------------------------------------------------------------- Physician Orders Details Patient Name: Nathan Moore Date of Service: 07/20/2016 9:45 AM Medical Record Number: [id number] Patient Account Number: [id number] Date of Birth/Sex: Jul 26, 1996 (20 y.o. Male) Treating RN: Curtis Sites Primary Care Physician: Jackie Plum Other Clinician: Referring Physician: Jackie Plum Treating Physician/Extender: Eugene Garnet in Treatment: 0 Verbal / Phone Orders: Yes Clinician: Curtis Sites Read Back and Verified: Yes Diagnosis Coding ICD-10 Coding Code Description S61.300A Unspecified open wound of right index finger with damage to nail, initial encounter S61.002A Unspecified open wound of left thumb without damage to nail, initial encounter F84.0 Autistic disorder F72 Severe intellectual disabilities Wound Cleansing Wound #1 Right Hand - 2nd Digit o Clean wound with Normal Saline. o May shower with protection. Wound #2 Left Hand - 1st Digit o Clean wound with Normal Saline. o May shower with protection. Anesthetic Wound #1 Right Hand - 2nd Digit o Topical Lidocaine 4% cream applied to wound bed prior to debridement Wound #2 Left Hand - 1st Digit o  Topical Lidocaine 4% cream applied to wound bed prior to debridement Primary Wound Dressing Wound #1 Right Hand - 2nd Digit o Aquacel - do this only if needed for wound #2 - this wound left open to air by [facility] o Medihoney gel - do this only if needed for wound #2 - this wound left open to air by [facility] Wound #2 Left Hand - 1st Digit o Aquacel - do this only if needed for wound #2 - this wound left open to air by [facility] o Medihoney gel - do this only if needed for wound #2 - this wound left open to air by [facility] [place], [name] ([id number]) Secondary Dressing Wound #1 Right Hand - 2nd Digit o Conform/Kerlix - secure lightly with coban - around hand and wrist - do this only if needed for wound #2 - this wound left open to air by [facility] W[facility] Wound #2 Left Hand - 1st Digit o Conform/Kerlix - secure lightly with coban - around hand and wrist - do this only if needed for wound #2 - this wound left open to air by [facility] Dressing Change Frequency Wound #1 Right Hand - 2nd Digit o Change Dressing Monday, Wednesday, Friday - and as needed if patient removes dressing or soiled or wet Wound #2 Left Hand - 1st Digit o Change Dressing Monday, Wednesday, Friday - and as needed if patient removes dressing or soiled or wet Follow-up Appointments Wound #1 Right Hand - 2nd Digit o Return Appointment in 1 week. Wound #2 Left Hand - 1st Digit o Return Appointment in 1 week. Home Health Wound #1 Right Hand - 2nd Digit o Continue Home Health Visits o Home Health Nurse may visit PRN to address patientos wound care needs. o FACE TO FACE ENCOUNTER: MEDICARE and MEDICAID PATIENTS: I certify that this patient is under my care and that I had a face-to-face encounter that meets the physician face-to-face encounter requirements with this patient on this date. The encounter  with the patient was in whole or in part for the following MEDICAL CONDITION: (primary reason for Home Healthcare) MEDICAL NECESSITY: I certify, that based on my findings, NURSING services are a medically necessary home health service. HOME BOUND STATUS: I certify that my clinical findings support  that this patient is homebound (i.e., Due to illness or injury, pt requires aid of supportive devices such as crutches, cane, wheelchairs, walkers, the use of special transportation or the assistance of another person to leave their place of residence. There is a normal inability to leave the home and doing so requires considerable and taxing effort. Other absences are for medical reasons / religious services and are infrequent or of short duration when for other reasons). o If current dressing causes regression in wound condition, may D/C ordered dressing product/s and apply Normal Saline Moist Dressing daily until next Wound Healing Center / Other MD appointment. Notify Wound Healing Center of regression in wound condition at [phone]. o Please direct any NON-WOUND related issues/requests for orders to patient's Primary Care Physician Wound #2 Left Hand - 1st Digit o Continue Home Health Visits Nathan RowerUSTIN, Nathan Moore ([id number]) o Home Health Nurse may visit PRN to address patientos wound care needs. o FACE TO FACE ENCOUNTER: MEDICARE and MEDICAID PATIENTS: I certify that this patient is under my care and that I had a face-to-face encounter that meets the physician face-to-face encounter requirements with this patient on this date. The encounter with the patient was in whole or in part for the following MEDICAL CONDITION: (primary reason for Home Healthcare) MEDICAL NECESSITY: I certify, that based on my findings, NURSING services are a medically necessary home health service. HOME BOUND STATUS: I certify that my clinical findings support that this patient is homebound (i.e., Due to  illness or injury, pt requires aid of supportive devices such as crutches, cane, wheelchairs, walkers, the use of special transportation or the assistance of another person to leave their place of residence. There is a normal inability to leave the home and doing so requires considerable and taxing effort. Other absences are for medical reasons / religious services and are infrequent or of short duration when for other reasons). o If current dressing causes regression in wound condition, may D/C ordered dressing product/s and apply Normal Saline Moist Dressing daily until next Wound Healing Center / Other MD appointment. Notify Wound Healing Center of regression in wound condition at [phone]. o Please direct any NON-WOUND related issues/requests for orders to patient's Primary Care Physician Consults o General Surgery - Dr Magnus IvanBlackman - ortho surgery Electronic Signature(s) Signed: 07/20/2016 4:21:25 PM By: Georges LynchSaunders, Fallen Crisostomo FNP Signed: 07/20/2016 5:08:51 PM By: Curtis Sitesorthy, Joanna Entered By: Curtis Sitesorthy, Joanna on 07/20/2016 12:04:11 Nathan RowerAUSTIN, Lyrik ([id number]) -------------------------------------------------------------------------------- Problem List Details Patient Name: Nathan RowerAUSTIN, Nathan Moore Date of Service: 07/20/2016 9:45 AM Medical Record Number: [id number] Patient Account Number: [id number] Date of Birth/Sex: 09-Dec-1995 (20 y.o. Male) Treating RN: Phillis HaggisPinkerton, Debi Primary Care Physician: Jackie PlumSEI-BONSU, GEORGE Other Clinician: Referring Physician: Jackie PlumSEI-BONSU, GEORGE Treating Physician/Extender: Eugene GarnetSaunders, Kennis Wissmann Weeks in Treatment: 0 Active Problems ICD-10 Encounter Code Description Active Date Diagnosis S61.300A Unspecified open wound of right index finger with damage 07/20/2016 Yes to nail, initial encounter S61.002A Unspecified open wound of left thumb without damage to 07/20/2016 Yes nail, initial encounter F84.0 Autistic disorder 07/20/2016 Yes F72 Severe intellectual disabilities  07/20/2016 Yes Inactive Problems Resolved Problems Electronic Signature(s) Signed: 07/20/2016 4:21:25 PM By: Georges LynchSaunders, Bexley Laubach FNP Entered By: Georges LynchSaunders, Elmond Poehlman on 07/20/2016 11:43:50 Nathan RowerAUSTIN, Nathan Moore ([id number]) -------------------------------------------------------------------------------- Progress Note Details Patient Name: Nathan RowerAUSTIN, Nathan Moore Date of Service: 07/20/2016 9:45 AM Medical Record Number: [id number] Patient Account Number: [id number] Date of Birth/Sex: 09-Dec-1995 (20 y.o. Male) Treating RN: Curtis Sitesorthy, Joanna Primary Care Physician: Jackie PlumSEI-BONSU, GEORGE Other Clinician: Referring Physician: Jackie PlumSEI-BONSU, GEORGE Treating Physician/Extender: Eugene GarnetSaunders, Tiombe Tomeo Weeks in Treatment: 0 Subjective Chief Complaint  Information obtained from Caregiver New referral for a right index finger and left 1st finger wounds. History of Present Illness (HPI) The following HPI elements were documented for the patient's wound: Location: right index and 3rd fingers Quality: He is unable to verbalize pain. he doesn't exhibit emotion today. Severity: unable to provide severity secondary to mental status and nonverbal status. Duration: approximately 06/27/16 Timing: unable to provide this info. Context: biting, chewing and rubbing his fingers together. caregiver reports rubbing occurs coming out of a seizure Modifying Factors: Bactroban, xeroform, constant trauma from self injury, caregiver reports he constantly removes dressings and bandages. Associated Signs and Symptoms: necrotic tip of right index finger, drainage Patient was referred today for evaluation and management of wounds to the right index and middle fingers. he is nonverbal with hx of autism, moderate intellectual disability, epilepsy and mental retardation. He currently resides in a group home and is accompanied by a caretaker. I reviewed hospital clinical notes and imaging. Information is very limited. Review of Xray findings dated 06/27/16  showed marked soft tissue swelling of the right index finger and the third finger. there was no evidence for a foreign bodies or gas collections. no underlying bony changes that suggest osteomyelitis or fractures. Findings were compatible with cellulitis. Care giver, from group home, provides information for HPI. I also reviewed hospital EMR clinical notes. He was hospitalized in September for sepsis and cellulitis of right hand. He received IV abx and was converted to po abx. Wound culture was positive for MSSA. He was seen by Dr. Rayburn Ma, ortho, who performed debridement during his hospital stay. He was to f/u 2 weeks after d/c, but caregiver reports wasn't aware of this. His caregiver reports the wounds are worsening because pt removes dressings and chews and bites wounds and places fingers in his mouth. He is UTD on tetanus. Caregiver reports pt has tendencies for violent behavior. he is calm today. No reported fevers or body aches. Wound History Patient presents with 2 open wounds that have been present for approximately 1 month. Patient has been treating wounds in the following manner: bandage by [facility] Laboratory tests have not been performed in the last month. Patient reportedly has not tested positive for an antibiotic resistant organism. Patient reportedly has not tested positive for osteomyelitis. Patient reportedly has not had testing performed to evaluate circulation in the legs. Nathan Moore, Nathan Moore ([id number]) Patient History Information obtained from Caregiver. Allergies No Known Drug Allergies Social History Never smoker, Marital Status - Single, Alcohol Use - Never, Drug Use - No History, Caffeine Use - Never. Medical History Neurologic Patient has history of Seizure Disorder Oncologic Denies history of Received Chemotherapy, Received Radiation Psychiatric Denies history of Anorexia/bulimia, Confinement Anxiety Medical And Surgical History  Notes Ear/Nose/Mouth/Throat nonverbal Musculoskeletal abnormal involuntary movements Neurologic autism, mental retardation, behavior disturbance Review of Systems (ROS) Constitutional Symptoms (General Health) The patient has no complaints or symptoms. Eyes The patient has no complaints or symptoms. Ear/Nose/Mouth/Throat The patient has no complaints or symptoms. Hematologic/Lymphatic The patient has no complaints or symptoms. Respiratory The patient has no complaints or symptoms. Cardiovascular The patient has no complaints or symptoms. Gastrointestinal The patient has no complaints or symptoms. Endocrine The patient has no complaints or symptoms. Genitourinary Complains or has symptoms of Incontinence/dribbling. Immunological The patient has no complaints or symptoms. Integumentary (Skin) The patient has no complaints or symptoms. Musculoskeletal Nathan Moore, Nathan Moore ([id number]) The patient has no complaints or symptoms. Neurologic The patient has no complaints or symptoms. Oncologic The patient has no  complaints or symptoms. Psychiatric The patient has no complaints or symptoms. Objective Constitutional Patient's appearance is neat and clean. Appears in no acute distress. Well nourished and well developed.. Vitals Time Taken: 9:54 AM, Height: 70 in, Source: Stated, Weight: 177 lbs, Source: Stated, BMI: 25.4, Pulse: 79 bpm, Respiratory Rate: 16 breaths/min, Blood Pressure: 123/66 mmHg. Ears, Nose, Mouth, and Throat Patient can hear normal speaking tones without difficulty. makes eye contact when spoken too.Marland Kitchen Respiratory Respiratory effort is easy and symmetric bilaterally. Rate is normal at rest and on room air.Marland Kitchen Psychiatric poor secondary to mental status. alert. makes eye contact when name spoken.. no evidence of anxiey or agitation. flat affect. cooperative.. Integumentary (Hair, Skin) Wound #1 status is Open. Original cause of wound was Trauma. The wound is  located on the Right Hand - 2nd Digit. The wound measures 5.3cm length x 7.5cm width x 0.1cm depth; 31.22cm^2 area and 3.122cm^3 volume. The wound is limited to skin breakdown. There is no tunneling or undermining noted. There is a medium amount of sanguinous drainage noted. The wound margin is flat and intact. There is small (1-33%) red granulation within the wound bed. There is a large (67-100%) amount of necrotic tissue within the wound bed including Eschar and Adherent Slough. The periwound skin appearance exhibited: Localized Edema, Maceration, Moist, Erythema. The periwound skin appearance did not exhibit: Callus, Crepitus, Excoriation, Fluctuance, Friable, Induration, Rash, Scarring, Dry/Scaly, Atrophie Blanche, Cyanosis, Ecchymosis, Hemosiderin Staining, Mottled, Pallor, Rubor. The surrounding wound skin color is noted with erythema which is circumferential. The periwound has tenderness on palpation. Wound #2 status is Open. Original cause of wound was Trauma. The wound is located on the Left Hand - 1st Digit. The wound measures 1cm length x 1cm width x 0.1cm depth; 0.785cm^2 area and 0.079cm^3 volume. The wound is limited to skin breakdown. There is no tunneling or undermining noted. There is a medium amount of serous drainage noted. The wound margin is flat and intact. There is large (67-100%) M[place], Rushil ([id number]) red, pink granulation within the wound bed. There is no necrotic tissue within the wound bed. The periwound skin appearance exhibited: Dry/Scaly. The periwound skin appearance did not exhibit: Callus, Crepitus, Excoriation, Fluctuance, Friable, Induration, Localized Edema, Rash, Scarring, Maceration, Moist, Atrophie Blanche, Cyanosis, Ecchymosis, Hemosiderin Staining, Mottled, Pallor, Rubor, Erythema. The periwound has tenderness on palpation. Assessment Active Problems ICD-10 S61.300A - Unspecified open wound of right index finger with damage to nail, initial  encounter S61.002A - Unspecified open wound of left thumb without damage to nail, initial encounter F84.0 - Autistic disorder F72 - Severe intellectual disabilities Plan Wound Cleansing: Wound #1 Right Hand - 2nd Digit: Clean wound with Normal Saline. May shower with protection. Wound #2 Left Hand - 1st Digit: Clean wound with Normal Saline. May shower with protection. Anesthetic: Wound #1 Right Hand - 2nd Digit: Topical Lidocaine 4% cream applied to wound bed prior to debridement Wound #2 Left Hand - 1st Digit: Topical Lidocaine 4% cream applied to wound bed prior to debridement Primary Wound Dressing: Wound #1 Right Hand - 2nd Digit: Aquacel Medihoney gel Wound #2 Left Hand - 1st Digit: Aquacel Medihoney gel Secondary Dressing: Wound #1 Right Hand - 2nd Digit: Conform/Kerlix - secure lightly with coban - around hand and wrist Wound #2 Left Hand - 1st Digit: Conform/Kerlix - secure lightly with coban - around hand and wrist Nathan Moore, Nathan Moore ([id number]) Dressing Change Frequency: Wound #1 Right Hand - 2nd Digit: Change Dressing Monday, Wednesday, Friday Wound #2 Left Hand - 1st  Digit: Change Dressing Monday, Wednesday, Friday Follow-up Appointments: Wound #1 Right Hand - 2nd Digit: Return Appointment in 1 week. Wound #2 Left Hand - 1st Digit: Return Appointment in 1 week. Home Health: Wound #1 Right Hand - 2nd Digit: Continue Home Health Visits Home Health Nurse may visit PRN to address patient s wound care needs. FACE TO FACE ENCOUNTER: MEDICARE and MEDICAID PATIENTS: I certify that this patient is under my care and that I had a face-to-face encounter that meets the physician face-to-face encounter requirements with this patient on this date. The encounter with the patient was in whole or in part for the following MEDICAL CONDITION: (primary reason for Home Healthcare) MEDICAL NECESSITY: I certify, that based on my findings, NURSING services are a medically necessary  home health service. HOME BOUND STATUS: I certify that my clinical findings support that this patient is homebound (i.e., Due to illness or injury, pt requires aid of supportive devices such as crutches, cane, wheelchairs, walkers, the use of special transportation or the assistance of another person to leave their place of residence. There is a normal inability to leave the home and doing so requires considerable and taxing effort. Other absences are for medical reasons / religious services and are infrequent or of short duration when for other reasons). If current dressing causes regression in wound condition, may D/C ordered dressing product/s and apply Normal Saline Moist Dressing daily until next Wound Healing Center / Other MD appointment. Notify Wound Healing Center of regression in wound condition at [phone]. Please direct any NON-WOUND related issues/requests for orders to patient's Primary Care Physician Wound #2 Left Hand - 1st Digit: Continue Home Health Visits Home Health Nurse may visit PRN to address patient s wound care needs. FACE TO FACE ENCOUNTER: MEDICARE and MEDICAID PATIENTS: I certify that this patient is under my care and that I had a face-to-face encounter that meets the physician face-to-face encounter requirements with this patient on this date. The encounter with the patient was in whole or in part for the following MEDICAL CONDITION: (primary reason for Home Healthcare) MEDICAL NECESSITY: I certify, that based on my findings, NURSING services are a medically necessary home health service. HOME BOUND STATUS: I certify that my clinical findings support that this patient is homebound (i.e., Due to illness or injury, pt requires aid of supportive devices such as crutches, cane, wheelchairs, walkers, the use of special transportation or the assistance of another person to leave their place of residence. There is a normal inability to leave the home and doing so  requires considerable and taxing effort. Other absences are for medical reasons / religious services and are infrequent or of short duration when for other reasons). If current dressing causes regression in wound condition, may D/C ordered dressing product/s and apply Normal Saline Moist Dressing daily until next Wound Healing Center / Other MD appointment. Notify Wound Healing Center of regression in wound condition at [phone]. Please direct any NON-WOUND related issues/requests for orders to patient's Primary Care Physician Follow-Up Appointments: A Patient Clinical Summary of Care was provided to ba Nathan Moore, Nathan Moore ([id number]) 1. reviewed hospital EMR including clinical notes, imaging and labs. 2. discussed clinical findings and implications with his caregiver. all questions were answered. 3. we will proceed with ortho f/u appt. 4. High risk for loss of his right index finger at this time. self injury continues. he declines to keep dressings in place. abnormal behaviors secondary to current mental status. 5. see orders above. Electronic Signature(s) Signed: 08/08/2016  3:38:03 PM By: Georges Lynch FNP Previous Signature: 07/20/2016 1:14:15 PM Version By: Curtis Sites Previous Signature: 07/20/2016 4:21:25 PM Version By: Georges Lynch FNP Entered By: Georges Lynch on 08/08/2016 15:38:02 Nathan Moore ([id number]) -------------------------------------------------------------------------------- ROS/PFSH Details Patient Name: Nathan Moore Date of Service: 07/20/2016 9:45 AM Medical Record Number: [id number] Patient Account Number: [id number] Date of Birth/Sex: [birth year] (20 y.o. Male) Treating RN: Curtis Sites Primary Care Physician: Jackie Plum Other Clinician: Referring Physician: Jackie Plum Treating Physician/Extender: Eugene Garnet in Treatment: 0 Information Obtained From Caregiver Wound History Do you currently have one or more open  woundso Yes How many open wounds do you currently haveo 2 Approximately how long have you had your woundso 1 month How have you been treating your wound(s) until nowo bandage by [facility] Has your wound(s) ever healed and then re-openedo No Have you had any lab work done in the past montho No Have you tested positive for an antibiotic resistant organism (MRSA, VRE)o No Have you tested positive for osteomyelitis (bone infection)o No Have you had any tests for circulation on your legso No Genitourinary Complaints and Symptoms: Positive for: Incontinence/dribbling Psychiatric Complaints and Symptoms: No Complaints or Symptoms Complaints and Symptoms: Negative for: Anxiety; Claustrophobia Medical History: Negative for: Anorexia/bulimia; Confinement Anxiety Constitutional Symptoms (General Health) Complaints and Symptoms: No Complaints or Symptoms Eyes Complaints and Symptoms: No Complaints or Symptoms Ear/Nose/Mouth/Throat Complaints and Symptoms: No Complaints or Symptoms Nathan Moore, Nathan Moore ([id number]) Medical History: Past Medical History Notes: nonverbal Hematologic/Lymphatic Complaints and Symptoms: No Complaints or Symptoms Respiratory Complaints and Symptoms: No Complaints or Symptoms Cardiovascular Complaints and Symptoms: No Complaints or Symptoms Gastrointestinal Complaints and Symptoms: No Complaints or Symptoms Endocrine Complaints and Symptoms: No Complaints or Symptoms Immunological Complaints and Symptoms: No Complaints or Symptoms Integumentary (Skin) Complaints and Symptoms: No Complaints or Symptoms Musculoskeletal Complaints and Symptoms: No Complaints or Symptoms Medical History: Past Medical History Notes: abnormal involuntary movements Neurologic Complaints and Symptoms: No Complaints or Symptoms Nathan Moore, Nathan Moore ([id number]) Medical History: Positive for: Seizure Disorder Past Medical History Notes: autism, mental retardation, behavior  disturbance Oncologic Complaints and Symptoms: No Complaints or Symptoms Medical History: Negative for: Received Chemotherapy; Received Radiation Immunizations Pneumococcal Vaccine: Received Pneumococcal Vaccination: No Family and Social History Never smoker; Marital Status - Single; Alcohol Use: Never; Drug Use: No History; Caffeine Use: Never; Financial Concerns: No; Food, Clothing or Shelter Needs: No; Support System Lacking: No; Transportation Concerns: No; Advanced Directives: Yes (Not Provided); Patient does not want information on Advanced Directives; Medical Power of Attorney: Yes - [name] (Copy provided) Electronic Signature(s) Signed: 07/20/2016 4:21:25 PM By: Georges Lynch FNP Signed: 07/20/2016 5:08:51 PM By: Curtis Sites Entered By: Curtis Sites on 07/20/2016 10:05:34 Nathan Moore ([id number]) -------------------------------------------------------------------------------- SuperBill Details Patient Name: Nathan Moore Date of Service: 07/20/2016 Medical Record Number: [id number] Patient Account Number: [id number] Date of Birth/Sex: 1995/11/17 (20 y.o. Male) Treating RN: Phillis Haggis Primary Care Physician: Jackie Plum Other Clinician: Referring Physician: Jackie Plum Treating Physician/Extender: Eugene Garnet in Treatment: 0 Diagnosis Coding ICD-10 Codes Code Description S61.300A Unspecified open wound of right index finger with damage to nail, initial encounter S61.002A Unspecified open wound of left thumb without damage to nail, initial encounter F84.0 Autistic disorder F72 Severe intellectual disabilities Facility Procedures CPT4 Code: 53664403 Description: 99214 - WOUND CARE VISIT-LEV 4 EST PT Modifier: Quantity: 1 Physician Procedures CPT4: Description Modifier Quantity Code 4742595 99204 - WC PHYS LEVEL 4 - NEW PT 1 ICD-10 Description Diagnosis S61.300A Unspecified open wound of right index finger with damage  to  nail, initial encounter S61.002A Unspecified open wound of left thumb  without damage to nail, initial encounter F84.0 Autistic disorder F72 Severe intellectual disabilities Electronic Signature(s) Signed: 07/20/2016 12:04:56 PM By: Curtis Sites Signed: 07/20/2016 4:21:25 PM By: Georges Lynch FNP Entered By: Curtis Sites on 07/20/2016 12:04:56

## 2016-07-23 ENCOUNTER — Encounter (HOSPITAL_COMMUNITY): Payer: Self-pay | Admitting: Emergency Medicine

## 2016-07-23 ENCOUNTER — Emergency Department (HOSPITAL_COMMUNITY)
Admission: EM | Admit: 2016-07-23 | Discharge: 2016-07-23 | Disposition: A | Discharge status: Home / Self Care | Payer: Medicaid Other | Attending: Emergency Medicine | Admitting: Emergency Medicine

## 2016-07-23 DIAGNOSIS — R569 Unspecified convulsions: Secondary | ICD-10-CM | POA: Insufficient documentation

## 2016-07-23 DIAGNOSIS — F840 Autistic disorder: Secondary | ICD-10-CM | POA: Insufficient documentation

## 2016-07-23 DIAGNOSIS — Z79899 Other long term (current) drug therapy: Secondary | ICD-10-CM | POA: Insufficient documentation

## 2016-07-23 LAB — I-STAT CHEM 8, ED
BUN: 8 mg/dL (ref 6–20)
CHLORIDE: 102 mmol/L (ref 101–111)
CREATININE: 0.8 mg/dL (ref 0.61–1.24)
Calcium, Ion: 1.15 mmol/L (ref 1.15–1.40)
Glucose, Bld: 86 mg/dL (ref 65–99)
HEMATOCRIT: 41 % (ref 39.0–52.0)
HEMOGLOBIN: 13.9 g/dL (ref 13.0–17.0)
POTASSIUM: 4 mmol/L (ref 3.5–5.1)
Sodium: 142 mmol/L (ref 135–145)
TCO2: 27 mmol/L (ref 0–100)

## 2016-07-23 MED ORDER — SODIUM CHLORIDE 0.9 % IV SOLN
1000.0000 mg | Freq: Once | INTRAVENOUS | Status: AC
  Administered 2016-07-23: 1000 mg via INTRAVENOUS
  Filled 2016-07-23: qty 10

## 2016-07-23 MED ORDER — LORAZEPAM 2 MG/ML IJ SOLN
INTRAMUSCULAR | Status: AC
  Administered 2016-07-23: 0.5 mg
  Filled 2016-07-23: qty 1

## 2016-07-23 MED ORDER — LORAZEPAM 2 MG/ML IJ SOLN
INTRAMUSCULAR | Status: AC
  Administered 2016-07-23: 1 mg
  Filled 2016-07-23: qty 1

## 2016-07-23 MED ORDER — LORAZEPAM 2 MG/ML IJ SOLN
0.5000 mg | Freq: Once | INTRAMUSCULAR | Status: AC
  Administered 2016-07-23: 0.5 mg via INTRAVENOUS
  Filled 2016-07-23: qty 1

## 2016-07-23 NOTE — Discharge Instructions (Signed)
Contact your neurologist dr. Sharene SkeansHickling if any problems

## 2016-07-23 NOTE — ED Notes (Signed)
Pt actively seizing turned on left side. Pt pulling scratching inside of mouth. Profuse bleeding noted to floor at bedside. Pt routinely bites fingers with hx of MR. Pt combative with staff intervention to maintain pt safety. Pt biting, hitting, kicking staff and attempting to remove equipment and IV. Profuse bleeding noted to lower molar region of mouth. Left worse than right. Provider aware. Restraints checks q 15 minutes until removed.

## 2016-07-23 NOTE — ED Triage Notes (Signed)
Pt from group home, had 3 seizures today per staff . Pt is non verbal . Per [facility] pt is alert yet disoriented normal to baseline. Pt is known for agitation and violant behavior post seizure activity.

## 2016-07-23 NOTE — ED Provider Notes (Signed)
[facility]-EMERGENCY DEPT Provider Note   CSN: [id number] Arrival date & time: 07/23/16  1537     History   Chief Complaint Chief Complaint  Patient presents with  . Seizures    HPI Mitchell Iwanicki is a 20 y.o. male.  Patient had a couple seizures today. Patient has autism and has frequent seizures is on Keppra   The history is provided by the EMS personnel.  Seizures   This is a recurrent problem. The current episode started less than 1 hour ago. There were 2 to 3 seizures. Associated symptoms include sleepiness. Characteristics include eye blinking. The episode was witnessed. There was no sensation of an aura present. The seizures continued in the ED. The seizure(s) had upper extremity focality. There has been no fever. There were no medications administered prior to arrival.    Past Medical History:  Diagnosis Date  . Absence seizures, intractable (HCC)   . Aggressive behavior   . Autism disorder   . Chronic static encephalopathy   . Seizures [facility])     Patient Active Problem List   Diagnosis Date Noted  . Cellulitis and abscess of hand 06/27/2016  . Abnormal involuntary movement 05/24/2016  . Autism spectrum disorder with accompanying language impairment, requiring very substantial support (level 3) 10/20/2015  . 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 Partial epilepsy with impairment of consciousness (HCC) 10/20/2015  . Epilepsy (HCC) 08/31/2015  . Behavior disturbance 08/31/2015  . Autism 08/31/2015  . MR (mental retardation) 08/31/2015    History reviewed. No pertinent surgical history.     Home Medications    Prior to Admission medications   Medication Sig Start Date End Date Taking? Authorizing Provider  cetirizine (ZYRTEC) 10 MG tablet Take 10 mg by mouth daily.    Historical Provider, MD  chlorproMAZINE (THORAZINE) 100 MG tablet Take 100-200 mg by mouth 2 (two) times daily. Take 100mg  in the morning and then 200mg  at night    Historical Provider,  MD  chlorproMAZINE (THORAZINE) 50 MG tablet TAKE 1 TABLET BY MOUTH EVERY 4 HOURS AS NEEDED FOR AGITATION 10/03/15   Historical Provider, MD  doxycycline (VIBRA-TABS) 100 MG tablet Take 100 mg by mouth 2 (two) times daily. 05/28/16   Historical Provider, MD  levETIRAcetam (KEPPRA) 500 MG tablet Take 3 tablets twice per day 07/16/16   Azalia Bilis, MD  ONFI 10 MG tablet TAKE ONE AND A HALF TABLETS BY MOUTH 2 TIMES DAILY Patient taking differently: TAKE ONE AND A HALF TABLETS= 15mg  BY MOUTH 2 TIMES DAILY 06/05/16   Elveria Rising, NP  propranolol (INDERAL) 80 MG tablet Take 80 mg by mouth 3 (three) times daily. 07/06/15   Historical Provider, MD  saccharomyces boulardii (FLORASTOR) 250 MG capsule Take 1 capsule (250 mg total) by mouth 2 (two) times daily. 07/05/16   Richarda Overlie, MD    Family History No family history on file.  Social History Social History  Substance Use Topics  . Smoking status: Never Smoker  . Smokeless tobacco: Never Used  . Alcohol use No     Allergies   Review of patient's allergies indicates no known allergies.   Review of Systems Review of Systems  Unable to perform ROS: Mental status change  Neurological: Positive for seizures.     Physical Exam Updated Vital Signs BP (!) 180/111   Pulse 71   Temp 98.2 F (36.8 C) (Axillary)   Resp 18   SpO2 100%   Physical Exam  Constitutional: He appears well-developed.  HENT:  Head: Normocephalic.  Eyes: Conjunctivae and EOM are normal. No scleral icterus.  Neck: Neck supple. No thyromegaly present.  Cardiovascular: Normal rate and regular rhythm.  Exam reveals no gallop and no friction rub.   No murmur heard. Pulmonary/Chest: No stridor. He has no wheezes. He has no rales. He exhibits no tenderness.  Abdominal: He exhibits no distension. There is no tenderness. There is no rebound.  Lymphadenopathy:    He has no cervical adenopathy.  Neurological: He is alert. He exhibits normal muscle tone. Coordination  normal.  Patient has autism and is mute.  Skin: No rash noted. No erythema.     ED Treatments / Results  Labs (all labs ordered are listed, but only abnormal results are displayed) Labs Reviewed  I-STAT CHEM 8, ED    EKG  EKG Interpretation None       Radiology No results found.  Procedures Procedures (including critical care time)  Medications Ordered in ED Medications  LORazepam (ATIVAN) injection 0.5 mg (0.5 mg Intravenous Given 07/23/16 1605)  levETIRAcetam (KEPPRA) 1,000 mg in sodium chloride 0.9 % 100 mL IVPB (0 mg Intravenous Stopped 07/23/16 1653)  LORazepam (ATIVAN) 2 MG/ML injection (1 mg  Given 07/23/16 1630)  LORazepam (ATIVAN) 2 MG/ML injection (0.5 mg  Given 07/23/16 1652)     Initial Impression / Assessment and Plan / ED Course  I have reviewed the triage vital signs and the nursing notes.  Pertinent labs & imaging results that were available during my care of the patient were reviewed by me and considered in my medical decision making (see chart for details).  Clinical Course  Patient with autism and mental retardation with chronic seizures. He had 2 seizures before coming to the ER and a seizure while he was in the emergency department. I spoke to his neurologist Dr.Hickling and he stated that a week ago the patient was increased to 1500 mg of Keppra twice a day. He states that his seizures probably will not be controlled. He does not want to increase the Keppra anymore. He stated that he felt comfortable letting the patient go home and have to 3 seizures a day. He will follow up with patient   Final Clinical Impressions(s) / ED Diagnoses   Final diagnoses:  Seizure [facility](HCC)    New Prescriptions New Prescriptions   No medications on file     Bethann BerkshireJoseph Kou Gucciardo, MD 07/23/16 1721

## 2016-07-23 NOTE — ED Notes (Signed)
Pt status unchanged. Less restrictive interventions used. Restraints CONTINUED at present time. 

## 2016-07-23 NOTE — ED Notes (Signed)
Pt status unchanged. Less restrictive interventions used. Restraints CONTINUED at present time.

## 2016-07-24 ENCOUNTER — Ambulatory Visit (INDEPENDENT_AMBULATORY_CARE_PROVIDER_SITE_OTHER): Payer: Medicaid Other | Admitting: Pediatrics

## 2016-07-24 ENCOUNTER — Encounter (INDEPENDENT_AMBULATORY_CARE_PROVIDER_SITE_OTHER): Payer: Self-pay | Admitting: Pediatrics

## 2016-07-24 VITALS — BP 110/78 | HR 64 | Ht 73.5 in | Wt 179.4 lb

## 2016-07-24 DIAGNOSIS — F71 Moderate intellectual disabilities: Secondary | ICD-10-CM

## 2016-07-24 DIAGNOSIS — F840 Autistic disorder: Secondary | ICD-10-CM

## 2016-07-24 DIAGNOSIS — G40209 Localization-related (focal) (partial) symptomatic epilepsy and epileptic syndromes with complex partial seizures, not intractable, without status epilepticus: Secondary | ICD-10-CM | POA: Diagnosis not present

## 2016-07-24 MED ORDER — FYCOMPA 2 MG PO TABS: ORAL_TABLET | ORAL | 5 refills | Status: DC

## 2016-07-24 NOTE — Patient Instructions (Signed)
Levetiracetam will not change. Decrease Onfi to 1 tablet twice daily for 1 week, then one half tablet twice daily for 1 week, then discontinue. Start Fycompa 2 mg twice daily.  We will adjust this upward as Onfi is discontinued.  Major side effects would include change in mood and behavior, and sleepiness.

## 2016-07-24 NOTE — Progress Notes (Signed)
Patient: Nathan Moore MRN: [id number] Sex: male DOB: [birth year]  Provider: Deetta PerlaHICKLING,Naseer Hearn H, MD Location of Care: [facility]  Note type: Routine return visit  History of Present Illness: Referral Source: Melvyn Novasarmen Dohmeier, MD History from: caregiver, [facility] and [facility] Chief Complaint: Hospital Follow-up for Seizures  Nathan Moore is a 20 y.o. male who was evaluated on July 24, 2016, for the first time since May 24, 2016.  He has a history of complex partial seizures with impairment of consciousness and some clonic activity of his arms.  He has autism spectrum disorder with accompanying language impairment and moderate intellectual disability requiring very substantial support.  On occasion, he has significant problems with behavior.  He has had some self-mutilatory behavior where he rubs his fingers together for long periods of time and has caused cellulitis in his index finger and thumb which then caused stomach infection.  He had frequent seizures when I saw him initially on October 20, 2015.  [facility] who [facility] that [facility] despite his age, because of his autism and uncontrolled seizures.  He had been treated with extended release carbamazepine and for short time with clonazepam and then Onfi.  He experienced seizures three to four times per week.  [facility] and was last seen in 2016.  He has had only one EEG which was carried out with sedated sleep with lorazepam which predictably showed slowing of the background and no interictal activity.  Since his last visit on May 24, 2016 the frequency of his seizures has increased.  He had a cluster of two to three seizures lasting 30 to 120 seconds on June 11, 2016 these were described as involving rhythmic jerking, however, I have seen episodes and he has significant complex partial  symptomatology and has very slight twitching of his fingers in both hands.    His next seizure occurred on June 22, 2016 as he was getting off school bus around 3:30 p.m.  This lasted 10 to 12 minutes and spontaneously subsided.  He was noted to have significant abrasions on his fingers as described above.  An emergency room visit involved cellulitis of his hands and elevated temperature 102.3 degrees rectally.  He had gangrenous abrasions on his nail on the left thumb.  He was taking doxycycline.  He was given a tetanus shot and his wounds were wrapped with CoBand.  Pictures are in a ED progress note from September 13th and show significant cellulitis, bullae and gangrenous changes.  He was placed on Zosyn and vancomycin.  He had debridement of his wounds.  He was hospitalized from September 13th through 21st.  Interestingly he had no seizures while he was hospitalized during a time of significant physical stress.    His next seizure occurred on July 16, 2016, he was at school in the restroom and had a witnessed seizure with incontinence.  Duration of the seizure was unknown.  Apparently seizures recurred because he received two doses of Ativan.  Currently there were four seizures during that time.  I had placed him on Onfi on his last visit in addition to levetiracetam because I felt that we were at maximum useful dose of levetiracetam.  He received a dose of IV Keppra as a bolus and his levetiracetam was increased to 1500 mg twice daily.  I do not think it was appreciated by the emergency room doctor that  he was already on a second medication.    He was seen on October 6th by Georges Lynch a nurse practitioner working for Dr. Magnus Ivan who saw him in the hospital for his cellulitis.  His most recent seizures occurred on October 9th when he had three.  He has brought to the emergency department actively having a seizure and turned on his left side.  He was pulling and scratching the inside of his  mouth with profuse bleeding from there.  He was agitated and confused.  I was contacted by Dr. Estell Harpin who was providing care for him.  I stated that at this time he was on two medications that we would probably have to switch medications.  I did not mention to him that he had an office visit today.  A video was made of the event, which showed the patient staring with his eyes deviated to the right focused images of his hand showed rhythmic jerking of his fingers on the right and left.  Stiffening of his forearms followed by repetitive moaning.  His pupils were dilated.    His caregiver, [name] oversees his care at the group home where he lives.  He is followed by psychiatry and is on Abilify, chlorpromazine, and propranolol for his behavior.  It is not clear at this time why the seizure frequency has markedly increased, but it is clear that levetiracetam which initially helped him is not doing as well and that Onfi has not made any positive change in him at a fairly high dose.  His postictal period varies from minutes to as long as an hour.  He goes to bed between 11:30 and 12 o'clock and slept at 6 o'clock.  He sleeps for those six hours soundly.  There is no way to change this.  [name] attends [employer].  I doubt that he is benefitting much from school, however, the school officials have wanted to shorten his day because of his seizures that makes no sense.  A shorter day will not result in less seizures.  Review of Systems: 12 system review was remarkable for increase in seizures; the remainder was assessed and was negative  Past Medical History Diagnosis Date  . Absence seizures, intractable (HCC)   . Aggressive behavior   . Autism disorder   . Chronic static encephalopathy   . Seizures (HCC)    Hospitalizations: Yes.  , Head Injury: No., Nervous System Infections: No., Immunizations up to date: Yes.    See office note from [facility] August 31, 2015 for  recent history related to seizures; recent medications used to attempt to control seizures included carbamazepine, clonazepam, and Onfi; Some episodes appeared clearly to be complex partial in nature and prolonged; others are less clearcut behaviors where he seems to be unresponsive and then suddenly returns to his baseline  Birth History 8 lbs. 10 oz. infant born at [redacted] weeks gestational age to a 19 year old male. Gestation was complicated by gestational diabetes Mother received Epidural anesthesia  primary cesarean section Nursery Course was complicated by hypoglycemia, requirement for oxygen supplementation, elevated bilirubin Growth and Development was recalled as global delays, problems with behavior started at age 82, seizures at age 63-1/2  Behavior History autism spectrum disorder (level III)  Surgical History History reviewed. No pertinent surgical history.  Family History family history is not on file. Family history is negative for migraines, seizures, intellectual disabilities, blindness, deafness, birth defects, chromosomal disorder, or autism.  Social History . Marital  status: Single    Spouse name: N/A  . Number of children: N/A  . Years of education: N/A   Social History Main Topics  . Smoking status: Never Smoker  . Smokeless tobacco: Never Used  . Alcohol use No  . Drug use: No  . Sexual activity: No   Social History Narrative    [name] is non-verbal. 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     He lives at [facility] group home and has been there for 6-7 months.     His parents live in [place] and they visit once a month.     His caregiver reports him to be complaint at 8/10 of his medical visits and he states that his non-compliance does not include aggression, he will just sit in the same spot and not move.    No Known Allergies  Physical Exam BP 110/78   Pulse 64   Ht 6' 1.5" (1.867 m)   Wt 179 lb 6.4 oz (81.4 kg)   BMI 23.35  kg/m   General: alert, well developed, well nourished, in no acute distress, brown hair, brown eyes, right handed Head: normocephalic, no dysmorphic features Ears, Nose and Throat: Otoscopic: tympanic membranes normal; pharynx: oropharynx is pink without exudates or tonsillar hypertrophy Neck: supple, full range of motion, no cranial or cervical bruits Respiratory: auscultation clear Cardiovascular: no murmurs, pulses are normal Musculoskeletal: no skeletal deformities or apparent scoliosis; bandages on his left index finger and right thumb Skin: no rashes or neurocutaneous lesions  Neurologic Exam  Mental Status: alert; sat quietly on the table was not restless, nonverbal, able to follow some simple commands Cranial Nerves: visual fields are full to double simultaneous stimuli; extraocular movements are full and conjugate; pupils are round reactive to light; funduscopic examination shows positive red reflex bilaterally; symmetric facial strength; midline tongue and uvula; air conduction is greater than bone conduction bilaterally Motor: normal functional strength, tone and mass; good fine motor movements; no pronator drift Sensory: withdrawal 4 Coordination: good finger-to-nose, rapid repetitive alternating movements and finger apposition Gait and Station: gait is broad-based and shuffling, balance is adequate Reflexes: symmetric and diminished bilaterally; no clonus; bilateral flexor plantar responses  Assessment 1. Partial epilepsy with impairment of consciousness, G40.209. 2. Autism spectrum disorder with accompanying language impairment, F84.0. 3. 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  Discussion I am not certain why Inigo is having increasing frequency of seizures.  Clearly change in his treatment regimen is indicated.    Plan Since levetiracetam helped him significantly after it was started in January 2017, I am going to leave that unchanged.  Onfi will be gradually tapered  and in its place we will start Fycompa.  The major concern I have about Fycompa is that it can cause problems with mood and behavior similar to levetiracetam which fortunately did not happen.  He will return to see me in two months' time.  I noted that his caregiver will contact me if he has further seizures.  We will gradually adjust Fycompa upwards to try to avoid sedation.  I would be very interested in obtaining an EEG, but I do not know how it will be possible given his sensory integration issues that make it difficult to do any procedure with [name].  I do not know if we will be able to heal his index finger and his thumb.  He does not want to keep the bandages on and will bite through them.  I left  them in place at this time.  There seems to be no oozing from the wounds.  He does not have fever.  The lesions have not progressed.   Medication List   Accurate as of 07/24/16  8:49 AM.      cetirizine 10 MG tablet Commonly known as:  ZYRTEC Take 10 mg by mouth daily.   chlorproMAZINE 100 MG tablet Commonly known as:  THORAZINE Take 100-200 mg by mouth 2 (two) times daily. Take 100mg  in the morning and then 200mg  at night   chlorproMAZINE 50 MG tablet Commonly known as:  THORAZINE TAKE 1 TABLET BY MOUTH EVERY 4 HOURS AS NEEDED FOR AGITATION   doxycycline 100 MG tablet Commonly known as:  VIBRA-TABS Take 100 mg by mouth 2 (two) times daily.   FYCOMPA 2 MG Tabs Generic drug:  Perampanel Take 1 tablet twice daily   levETIRAcetam 500 MG tablet Commonly known as:  KEPPRA Take 3 tablets twice per day   ONFI 10 MG tablet Generic drug:  cloBAZam TAKE ONE AND A HALF TABLETS BY MOUTH 2 TIMES DAILY   propranolol 80 MG tablet Commonly known as:  INDERAL Take 80 mg by mouth 3 (three) times daily.   saccharomyces boulardii 250 MG capsule Commonly known as:  FLORASTOR Take 1 capsule (250 mg total) by mouth 2 (two) times daily.     The medication list was reviewed and reconciled. All  changes or newly prescribed medications were explained.  A complete medication list was provided to the patient/caregiver.  Deetta Perla MD

## 2016-07-25 ENCOUNTER — Ambulatory Visit (INDEPENDENT_AMBULATORY_CARE_PROVIDER_SITE_OTHER): Payer: Medicaid Other | Admitting: Orthopaedic Surgery

## 2016-07-25 DIAGNOSIS — M87044 Idiopathic aseptic necrosis of right finger(s): Secondary | ICD-10-CM

## 2016-07-27 ENCOUNTER — Encounter (HOSPITAL_BASED_OUTPATIENT_CLINIC_OR_DEPARTMENT_OTHER): Payer: Medicaid Other | Attending: Internal Medicine

## 2016-07-27 ENCOUNTER — Ambulatory Visit: Payer: Medicaid Other | Admitting: Nurse Practitioner

## 2016-07-27 DIAGNOSIS — F71 Moderate intellectual disabilities: Secondary | ICD-10-CM | POA: Diagnosis not present

## 2016-07-27 DIAGNOSIS — G40909 Epilepsy, unspecified, not intractable, without status epilepticus: Secondary | ICD-10-CM | POA: Insufficient documentation

## 2016-07-27 DIAGNOSIS — I96 Gangrene, not elsewhere classified: Secondary | ICD-10-CM | POA: Insufficient documentation

## 2016-07-27 DIAGNOSIS — S61002A Unspecified open wound of left thumb without damage to nail, initial encounter: Secondary | ICD-10-CM | POA: Diagnosis not present

## 2016-07-27 DIAGNOSIS — L03113 Cellulitis of right upper limb: Secondary | ICD-10-CM | POA: Diagnosis not present

## 2016-07-27 DIAGNOSIS — S61200A Unspecified open wound of right index finger without damage to nail, initial encounter: Secondary | ICD-10-CM | POA: Diagnosis not present

## 2016-07-27 DIAGNOSIS — X58XXXA Exposure to other specified factors, initial encounter: Secondary | ICD-10-CM | POA: Insufficient documentation

## 2016-07-27 DIAGNOSIS — F840 Autistic disorder: Secondary | ICD-10-CM | POA: Diagnosis not present

## 2016-08-01 ENCOUNTER — Encounter (HOSPITAL_COMMUNITY): Payer: Self-pay | Admitting: *Deleted

## 2016-08-01 ENCOUNTER — Other Ambulatory Visit: Payer: Self-pay | Admitting: Orthopaedic Surgery

## 2016-08-01 DIAGNOSIS — I96 Gangrene, not elsewhere classified: Secondary | ICD-10-CM | POA: Diagnosis not present

## 2016-08-01 DIAGNOSIS — F840 Autistic disorder: Secondary | ICD-10-CM | POA: Diagnosis not present

## 2016-08-01 DIAGNOSIS — X58XXXA Exposure to other specified factors, initial encounter: Secondary | ICD-10-CM | POA: Diagnosis not present

## 2016-08-01 DIAGNOSIS — S61200A Unspecified open wound of right index finger without damage to nail, initial encounter: Secondary | ICD-10-CM | POA: Diagnosis present

## 2016-08-03 ENCOUNTER — Ambulatory Visit (HOSPITAL_COMMUNITY): Payer: Medicaid Other | Admitting: Certified Registered Nurse Anesthetist

## 2016-08-03 ENCOUNTER — Encounter (HOSPITAL_COMMUNITY): Payer: Self-pay | Admitting: *Deleted

## 2016-08-03 ENCOUNTER — Ambulatory Visit (HOSPITAL_COMMUNITY)
Admission: RE | Admit: 2016-08-03 | Discharge: 2016-08-03 | Disposition: A | Discharge status: Home / Self Care | Payer: Medicaid Other | Source: Ambulatory Visit | Attending: Orthopaedic Surgery | Admitting: Orthopaedic Surgery

## 2016-08-03 ENCOUNTER — Encounter (HOSPITAL_COMMUNITY)
Admission: RE | Disposition: A | Discharge status: Home / Self Care | Payer: Self-pay | Source: Ambulatory Visit | Attending: Orthopaedic Surgery

## 2016-08-03 DIAGNOSIS — M87044 Idiopathic aseptic necrosis of right finger(s): Secondary | ICD-10-CM | POA: Diagnosis not present

## 2016-08-03 DIAGNOSIS — F840 Autistic disorder: Secondary | ICD-10-CM | POA: Insufficient documentation

## 2016-08-03 DIAGNOSIS — S61200A Unspecified open wound of right index finger without damage to nail, initial encounter: Secondary | ICD-10-CM | POA: Insufficient documentation

## 2016-08-03 DIAGNOSIS — I96 Gangrene, not elsewhere classified: Secondary | ICD-10-CM

## 2016-08-03 DIAGNOSIS — X58XXXA Exposure to other specified factors, initial encounter: Secondary | ICD-10-CM | POA: Insufficient documentation

## 2016-08-03 HISTORY — DX: Cardiac murmur, unspecified: R01.1

## 2016-08-03 HISTORY — DX: Other specified postprocedural states: Z98.890

## 2016-08-03 HISTORY — PX: AMPUTATION: SHX166

## 2016-08-03 HISTORY — DX: Nausea with vomiting, unspecified: R11.2

## 2016-08-03 SURGERY — AMPUTATION DIGIT
Anesthesia: General | Site: Finger | Laterality: Right

## 2016-08-03 MED ORDER — LIDOCAINE HCL (CARDIAC) 20 MG/ML IV SOLN
INTRAVENOUS | Status: DC | PRN
  Administered 2016-08-03: 50 mg via INTRAVENOUS

## 2016-08-03 MED ORDER — LACTATED RINGERS IV SOLN
INTRAVENOUS | Status: DC
  Administered 2016-08-03: 1000 mL via INTRAVENOUS
  Administered 2016-08-03: 14:00:00 via INTRAVENOUS

## 2016-08-03 MED ORDER — FENTANYL CITRATE (PF) 100 MCG/2ML IJ SOLN
INTRAMUSCULAR | Status: AC
  Filled 2016-08-03: qty 2

## 2016-08-03 MED ORDER — MIDAZOLAM HCL 5 MG/5ML IJ SOLN
1.0000 mg | INTRAMUSCULAR | Status: DC | PRN
  Administered 2016-08-03: 2 mg via INTRAVENOUS

## 2016-08-03 MED ORDER — PROPOFOL 10 MG/ML IV BOLUS
INTRAVENOUS | Status: AC
  Filled 2016-08-03: qty 20

## 2016-08-03 MED ORDER — EPHEDRINE 5 MG/ML INJ
INTRAVENOUS | Status: AC
  Filled 2016-08-03: qty 10

## 2016-08-03 MED ORDER — HYDROCODONE-ACETAMINOPHEN 5-325 MG PO TABS: 1.0000 | ORAL_TABLET | Freq: Four times a day (QID) | ORAL | 0 refills | Status: AC | PRN

## 2016-08-03 MED ORDER — MIDAZOLAM HCL 2 MG/ML PO SYRP
20.0000 mg | ORAL_SOLUTION | ORAL | Status: DC
  Filled 2016-08-03: qty 10

## 2016-08-03 MED ORDER — BUPIVACAINE HCL (PF) 0.25 % IJ SOLN
INTRAMUSCULAR | Status: AC
  Filled 2016-08-03: qty 30

## 2016-08-03 MED ORDER — PROPOFOL 10 MG/ML IV BOLUS
INTRAVENOUS | Status: DC | PRN
  Administered 2016-08-03 (×3): 100 mg via INTRAVENOUS

## 2016-08-03 MED ORDER — BUPIVACAINE HCL (PF) 0.25 % IJ SOLN
INTRAMUSCULAR | Status: DC | PRN
  Administered 2016-08-03: 10 mL

## 2016-08-03 MED ORDER — ONDANSETRON HCL 4 MG/2ML IJ SOLN
INTRAMUSCULAR | Status: AC
  Filled 2016-08-03: qty 2

## 2016-08-03 MED ORDER — EPHEDRINE SULFATE 50 MG/ML IJ SOLN
INTRAMUSCULAR | Status: DC | PRN
  Administered 2016-08-03: 10 mg via INTRAVENOUS

## 2016-08-03 MED ORDER — LIDOCAINE 2% (20 MG/ML) 5 ML SYRINGE
INTRAMUSCULAR | Status: AC
  Filled 2016-08-03: qty 5

## 2016-08-03 MED ORDER — MIDAZOLAM HCL 2 MG/2ML IJ SOLN
INTRAMUSCULAR | Status: AC
  Filled 2016-08-03: qty 2

## 2016-08-03 MED ORDER — PHENYLEPHRINE 40 MCG/ML (10ML) SYRINGE FOR IV PUSH (FOR BLOOD PRESSURE SUPPORT)
PREFILLED_SYRINGE | INTRAVENOUS | Status: AC
Start: 2016-08-03 — End: 2016-08-03
  Filled 2016-08-03: qty 10

## 2016-08-03 MED ORDER — CEFAZOLIN SODIUM-DEXTROSE 2-4 GM/100ML-% IV SOLN
2.0000 g | INTRAVENOUS | Status: AC
  Administered 2016-08-03: 2 g via INTRAVENOUS
  Filled 2016-08-03: qty 100

## 2016-08-03 MED ORDER — HYDROMORPHONE HCL 1 MG/ML IJ SOLN
0.2500 mg | INTRAMUSCULAR | Status: DC | PRN
Start: 2016-08-03 — End: 2016-08-03

## 2016-08-03 MED ORDER — FENTANYL CITRATE (PF) 250 MCG/5ML IJ SOLN
INTRAMUSCULAR | Status: DC | PRN
  Administered 2016-08-03 (×2): 50 ug via INTRAVENOUS

## 2016-08-03 MED ORDER — CEFAZOLIN SODIUM-DEXTROSE 2-4 GM/100ML-% IV SOLN
INTRAVENOUS | Status: AC
  Filled 2016-08-03: qty 100

## 2016-08-03 MED ORDER — PROMETHAZINE HCL 25 MG/ML IJ SOLN: 6.2500 mg | INTRAMUSCULAR | Status: DC | PRN

## 2016-08-03 MED ORDER — SODIUM CHLORIDE 0.9 % IR SOLN
Status: DC | PRN
  Administered 2016-08-03: 1

## 2016-08-03 MED ORDER — FENTANYL CITRATE (PF) 100 MCG/2ML IJ SOLN
50.0000 ug | INTRAMUSCULAR | Status: DC | PRN
  Administered 2016-08-03: 50 ug via INTRAVENOUS

## 2016-08-03 SURGICAL SUPPLY — 26 items
BNDG COHESIVE 1X5 TAN STRL LF (GAUZE/BANDAGES/DRESSINGS) ×3 IMPLANT
BNDG COHESIVE 3X5 TAN STRL LF (GAUZE/BANDAGES/DRESSINGS) ×3 IMPLANT
BNDG COHESIVE 4X5 TAN STRL (GAUZE/BANDAGES/DRESSINGS) ×3 IMPLANT
BNDG CONFORM 2 STRL LF (GAUZE/BANDAGES/DRESSINGS) ×3 IMPLANT
BNDG CONFORM 3 STRL LF (GAUZE/BANDAGES/DRESSINGS) ×3 IMPLANT
BNDG ESMARK 4X9 LF (GAUZE/BANDAGES/DRESSINGS) ×3 IMPLANT
BNDG GAUZE ELAST 4 BULKY (GAUZE/BANDAGES/DRESSINGS) ×3 IMPLANT
DRAPE U-SHAPE 47X51 STRL (DRAPES) ×3 IMPLANT
ELECT PENCIL ROCKER SW 15FT (MISCELLANEOUS) ×3 IMPLANT
ELECT REM PT RETURN 15FT ADLT (MISCELLANEOUS) ×3 IMPLANT
GAUZE SPONGE 4X4 12PLY STRL (GAUZE/BANDAGES/DRESSINGS) ×3 IMPLANT
GAUZE XEROFORM 1X8 LF (GAUZE/BANDAGES/DRESSINGS) ×3 IMPLANT
GLOVE BIO SURGEON STRL SZ7.5 (GLOVE) ×3 IMPLANT
GLOVE BIOGEL PI IND STRL 8 (GLOVE) ×1 IMPLANT
GLOVE BIOGEL PI INDICATOR 8 (GLOVE) ×2
GOWN STRL REUS W/TWL XL LVL3 (GOWN DISPOSABLE) ×3 IMPLANT
KIT BASIN OR (CUSTOM PROCEDURE TRAY) ×3 IMPLANT
PACK ORTHO EXTREMITY (CUSTOM PROCEDURE TRAY) ×3 IMPLANT
POSITIONER SURGICAL ARM (MISCELLANEOUS) ×3 IMPLANT
SUT ETHILON 2 0 PS N (SUTURE) ×6 IMPLANT
SWAB COLLECTION DEVICE MRSA (MISCELLANEOUS) ×3 IMPLANT
SWAB CULTURE ESWAB REG 1ML (MISCELLANEOUS) ×3 IMPLANT
SYR BULB IRRIGATION 50ML (SYRINGE) ×3 IMPLANT
SYR CONTROL 10ML LL (SYRINGE) ×3 IMPLANT
TOWEL OR 17X26 10 PK STRL BLUE (TOWEL DISPOSABLE) ×6 IMPLANT
YANKAUER SUCT BULB TIP NO VENT (SUCTIONS) ×3 IMPLANT

## 2016-08-03 NOTE — Discharge Instructions (Signed)
General Anesthesia, Adult, Care After Refer to this sheet in the next few weeks. These instructions provide you with information on caring for yourself after your procedure. Your health care provider may also give you more specific instructions. Your treatment has been planned according to current medical practices, but problems sometimes occur. Call your health care provider if you have any problems or questions after your procedure. WHAT TO EXPECT AFTER THE PROCEDURE After the procedure, it is typical to experience:  Sleepiness.  Nausea and vomiting. HOME CARE INSTRUCTIONS  For the first 24 hours after general anesthesia:  Have a responsible person with you.  Do not drive a car. If you are alone, do not take public transportation.  Do not drink alcohol.  Do not take medicine that has not been prescribed by your health care provider.  Do not sign important papers or make important decisions.  You may resume a normal diet and activities as directed by your health care provider.  Change bandages (dressings) as directed.  If you have questions or problems that seem related to general anesthesia, call the hospital and ask for the anesthetist or anesthesiologist on call. SEEK MEDICAL CARE IF:  You have nausea and vomiting that continue the day after anesthesia.  You develop a rash. SEEK IMMEDIATE MEDICAL CARE IF:   You have difficulty breathing.  You have chest pain.  You have any allergic problems.   This information is not intended to replace advice given to you by your health care provider. Make sure you discuss any questions you have with your health care provider.   Document Released: 01/07/2001 Document Revised: 10/22/2014 Document Reviewed: 01/30/2012 Elsevier Interactive Patient Education [employer]2016 Elsevier Inc.     Keep his right hand dressing clean and dry.

## 2016-08-03 NOTE — Transfer of Care (Signed)
Immediate Anesthesia Transfer of Care Note  Patient: Nathan RowerBenjamin Axley  Procedure(s) Performed: Procedure(s): RIGHT INDEX FINGER AMPUTATION (Right)  Patient Location: PACU  Anesthesia Type:General  Level of Consciousness: awake, alert  and oriented  Airway & Oxygen Therapy: Patient Spontanous Breathing and Patient connected to face mask oxygen  Post-op Assessment: Report given to RN and Post -op Vital signs reviewed and stable  Post vital signs: Reviewed and stable  Last Vitals:  Vitals:   08/03/16 1121 08/03/16 1448  BP: 114/90 127/75  Pulse:  86  Resp:  13  Temp:  (!) 36.1 C    Last Pain: There were no vitals filed for this visit.    Patients Stated Pain Goal: Other (Comment) (unable to determine) (08/03/16 1312)  Complications: No apparent anesthesia complications

## 2016-08-03 NOTE — Anesthesia Preprocedure Evaluation (Addendum)
Anesthesia Evaluation  Patient identified by MRN, date of birth, ID band Patient awake    Reviewed: Allergy & Precautions, NPO status , Patient's Chart, lab work & pertinent test results  History of Anesthesia Complications (+) PONV and history of anesthetic complications  Airway Mallampati: III  TM Distance: >3 FB Neck ROM: Full    Dental  (+) Poor Dentition, Dental Advisory Given   Pulmonary neg pulmonary ROS,    Pulmonary exam normal        Cardiovascular negative cardio ROS Normal cardiovascular exam     Neuro/Psych Seizures -,  PSYCHIATRIC DISORDERS Autism    GI/Hepatic negative GI ROS, Neg liver ROS,   Endo/Other  negative endocrine ROS  Renal/GU negative Renal ROS     Musculoskeletal   Abdominal   Peds  Hematology   Anesthesia Other Findings   Reproductive/Obstetrics                            Anesthesia Physical Anesthesia Plan  ASA: III  Anesthesia Plan: General   Post-op Pain Management:    Induction: Intravenous  Airway Management Planned: LMA  Additional Equipment:   Intra-op Plan:   Post-operative Plan: Extubation in OR  Informed Consent: I have reviewed the patients History and Physical, chart, labs and discussed the procedure including the risks, benefits and alternatives for the proposed anesthesia with the patient or authorized representative who has indicated his/her understanding and acceptance.   Consent reviewed with POA and Dental advisory given  Plan Discussed with: CRNA, Anesthesiologist and Surgeon  Anesthesia Plan Comments:        Anesthesia Quick Evaluation

## 2016-08-03 NOTE — H&P (Signed)
Nathan RowerBenjamin Moore is an 20 y.o. male.   Chief Complaint:   Right index finger necrosis HPI:   20 yo male with autism and severe mental impairment who has developed a right index finger wound that has lead to infection and necrosis.  He unfortunately does not have the mental capacity to leave it alone and has constantly picked at his finger and chewed at it.  It is at the point that necrosis has developed that is quite severe.  At this point, the only treatment option is an amputation.  Past Medical History:  Diagnosis Date  . Absence seizures, intractable (HCC)   . Aggressive behavior   . Autism disorder   . Chronic static encephalopathy   . Heart murmur   . PONV (postoperative nausea and vomiting)    FOLLOWING MRI UNDER GENERAL ANESTHERSIA ASPIRATED  . Seizures (HCC)     Past Surgical History:  Procedure Laterality Date  . DENTAL SURGERY      History reviewed. No pertinent family history. Social History:  reports that he has never smoked. He has never used smokeless tobacco. He reports that he does not drink alcohol or use drugs.  Allergies: No Known Allergies  No prescriptions prior to admission.    No results found for this or any previous visit (from the past 48 hour(s)). No results found.  ROS  There were no vitals taken for this visit. Physical Exam  Constitutional: He appears well-developed and well-nourished.  HENT:  Head: Normocephalic and atraumatic.  Eyes: Pupils are equal, round, and reactive to light.  Cardiovascular: Normal rate and regular rhythm.   Respiratory: Effort normal and breath sounds normal.  GI: Soft. Bowel sounds are normal.  Neurological: He is alert.  Skin: Skin is warm and dry.  Psychiatric:  Severe mental delay     Assessment/Plan Right index finger necrosis with infection 1)  Due to severity of the necrosis of his index finger, an amputation is warranted.  I have spoken in length to his mother about this and she understands fully and  gives informed consent.  Kathryne Hitchhristopher Y Korene Dula, MD 08/03/2016, 7:35 AM

## 2016-08-03 NOTE — Brief Op Note (Signed)
08/03/2016  2:44 PM  PATIENT:  Nathan Moore  20 y.o. male  PRE-OPERATIVE DIAGNOSIS:  ischemic right index finger  POST-OPERATIVE DIAGNOSIS:  ischemic right index finger  PROCEDURE:  Procedure(s): RIGHT INDEX FINGER AMPUTATION (Right)  SURGEON:  Surgeon(s) and Role:    * Kathryne Hitchhristopher Y Zanyiah Posten, MD - Primary  PHYSICIAN ASSISTANT: Rexene EdisonGil Clark, PA-C  ANESTHESIA:   local and general  LOCAL MEDICATIONS USED:  MARCAINE     PLAN OF CARE: Discharge to home after PACU  PATIENT DISPOSITION:  PACU - hemodynamically stable.   Delay start of Pharmacological VTE agent (>24hrs) due to surgical blood loss or risk of bleeding: no

## 2016-08-03 NOTE — Progress Notes (Signed)
Patient arrives to short stay after amputation of right index finger. He is nonverbal and does not seem to follow direction. There does not seem to be any signs of pain right hand. Patient is only interested in eating and drinking sodas. He consumed 5 sprites (888 ml) and 20 packs of graham crackers in 10 minutes. Does not want to get dressed. Nathan Moore, from group home, came up to assist RN. Patient refused to sit in wheelchair to be taken to front door. RN ambulated to front door with Nathan Moore, patient's father, and patient. Taken to group home by Nathan Moore, care giver.

## 2016-08-03 NOTE — Anesthesia Procedure Notes (Signed)
Procedure Name: LMA Insertion Date/Time: 08/03/2016 2:03 PM Performed by: Izora GalaHUGHES, Fatemah Pourciau A Pre-anesthesia Checklist: Patient identified, Emergency Drugs available, Suction available, Patient being monitored and Timeout performed Patient Re-evaluated:Patient Re-evaluated prior to inductionOxygen Delivery Method: Circle system utilized Preoxygenation: Pre-oxygenation with 100% oxygen Intubation Type: IV induction Ventilation: Mask ventilation without difficulty LMA: LMA inserted LMA Size: 5.0 Number of attempts: 1 Placement Confirmation: positive ETCO2 and breath sounds checked- equal and bilateral Dental Injury: Teeth and Oropharynx as per pre-operative assessment

## 2016-08-03 NOTE — Anesthesia Postprocedure Evaluation (Signed)
Anesthesia Post Note  Patient: Nathan RowerBenjamin Agard  Procedure(s) Performed: Procedure(s) (LRB): RIGHT INDEX FINGER AMPUTATION (Right)  Patient location during evaluation: PACU Anesthesia Type: General Level of consciousness: sedated Pain management: pain level controlled Vital Signs Assessment: post-procedure vital signs reviewed and stable Respiratory status: spontaneous breathing and respiratory function stable Cardiovascular status: stable Anesthetic complications: no    Last Vitals:  Vitals:   08/03/16 1522 08/03/16 1537  BP:  128/71  Pulse: 66 77  Resp: 13 15  Temp:      Last Pain: There were no vitals filed for this visit.               Kinzlie Harney DANIEL

## 2016-08-04 NOTE — Op Note (Signed)
NAMELAURANCE, HEIDE NO.:  [id number]  MEDICAL RECORD NO.:  [id number]  LOCATION:  [facility]                         FACILITY:  [facility]  PHYSICIAN:  Vanita Panda. Magnus Ivan, M.D.DATE OF BIRTH:  [birth year]  DATE OF PROCEDURE:  08/03/2016 DATE OF DISCHARGE:  08/03/2016                              OPERATIVE REPORT   PREOPERATIVE DIAGNOSIS:  Right index finger tip necrosis with infection and chronic wound.  POSTOPERATIVE DIAGNOSIS:  Right index finger tip necrosis with infection and chronic wound.  PROCEDURE:  Right index finger amputation through the proximal phalanx.  SURGEON:  Vanita Panda. Magnus Ivan, M.D.  ASSISTANT:  Richardean Canal, PA-C.  ANESTHESIA: 1. General. 2. Local with 0.25% plain Marcaine.  BLOOD LOSS:  Minimal.  COMPLICATIONS:  None.  INDICATIONS:  Gael is a 20 year old male with severe autism and mental delay with significant challenges in life.  He is in a group home, although he has parents who are very close to him.  He has developed chronic wounds on his left thumb and his right index finger and right thumb from constantly chewing out them and "flicking his fingers."  We saw him in the hospital last month for an infection that had developed on his index finger and performed a bedside debridement with unroofing blisters.  We have [facility] to [facility] and then tried to treat this as much as I could with getting it to heal, but now then to the finger tip as black.  He has obvious infection of his leg involving the bone as well.  This is not going to heal.  At this point, I spoke to his parents about a fingertip amputation with putting some type of dressing on it that we will keep him from picking at it.  Having seen his finger, they understand the reasoning behind proceeding with surgery.  PROCEDURE DESCRIPTION:  After informed consent was obtained from the parents, his right hand was marked.  He was brought to the operating room  and placed supine on the operating table.  General anesthesia was then obtained.  His right arm was placed on an arm table.  His right hand was prescrubbed with Betadine scrub and paint and then a regular Betadine over his hand.  Penrose drain was used as a Nurse, children's. A time-out was called and he was identified as correct patient and correct right index finger.  I then ellipsed out the necrotic index finger creating a flap and used a bone-cutting forceps to remove index finger through the PIP joint.  I then removed the condyles of the proximal phalanx and back the bone up just a little bit.  I made sure there were no sharp edges.  I then let the Penrose drain down and the neurovascular bundles were cauterized.  There was a nice good bleeding tissue.  I then irrigated the wound with normal saline solution and reapproximated a nice padded flap with interrupted 3-0 nylon suture.  We then infiltrated the digit with 0.25% plain Marcaine.  Xeroform and well- padded sterile dressing were applied all the way past his wrist to hopefully keep this on his hand.  He was awakened, extubated, and taken to the recovery room  in stable condition.  All final counts were correct. There were no complications noted.     Vanita Pandahristopher Y. Magnus IvanBlackman, M.D.     CYB/MEDQ  D:  08/03/2016  T:  08/04/2016  Job:  [id number]

## 2016-08-07 ENCOUNTER — Other Ambulatory Visit (INDEPENDENT_AMBULATORY_CARE_PROVIDER_SITE_OTHER): Payer: Self-pay | Admitting: Pediatrics

## 2016-08-07 DIAGNOSIS — G40209 Localization-related (focal) (partial) symptomatic epilepsy and epileptic syndromes with complex partial seizures, not intractable, without status epilepticus: Secondary | ICD-10-CM

## 2016-08-08 ENCOUNTER — Telehealth (INDEPENDENT_AMBULATORY_CARE_PROVIDER_SITE_OTHER): Payer: Self-pay | Admitting: *Deleted

## 2016-08-08 NOTE — Telephone Encounter (Signed)
It needs to stay on and clean until his follow-up

## 2016-08-08 NOTE — Telephone Encounter (Signed)
Nathan Moore with [facility] called with questions about the dressing and how to care for it. Callback number is [phone]

## 2016-08-08 NOTE — Telephone Encounter (Signed)
Please advise 

## 2016-08-09 NOTE — Telephone Encounter (Signed)
Aware of the below message  ?

## 2016-08-13 ENCOUNTER — Ambulatory Visit (INDEPENDENT_AMBULATORY_CARE_PROVIDER_SITE_OTHER): Payer: Medicaid Other | Admitting: Physician Assistant

## 2016-08-13 DIAGNOSIS — I96 Gangrene, not elsewhere classified: Secondary | ICD-10-CM

## 2016-08-13 NOTE — Progress Notes (Signed)
   Office Visit Note   Patient: Nathan Moore           Date of Birth: [birth year]           MRN: [id number] Visit Date: 08/13/2016              Requested by: Jackie PlumGeorge Osei-Bonsu, MD [id number] [place], [place] [id number] PCP: Jackie PlumSEI-BONSU,GEORGE, MD   Assessment & Plan: Visit Diagnoses:  1. Necrosis of finger (HCC)     Plan: Remove sutures in next office visit in 1 week.Xeroform and Coban were applied to the index finger traumatic leave this clean dry intact  Follow-Up Instructions: Return in about 1 week (around 08/20/2016) for wound check.   Orders:  No orders of the defined types were placed in this encounter.  No orders of the defined types were placed in this encounter.     Procedures: No procedures performed   Clinical Data: No additional findings.   Subjective: Chief Complaint  Patient presents with  . Right Hand - Wound Check    Status post right index finger amputation 08/03/16.  10 days post op. Patient bit off dressing on right index finger amputation yesterday.  He is putting his finger in his mouth. Sutures intact. Continued swelling.    Review of Systems   Objective: Vital Signs: There were no vitals taken for this visit.  Physical Exam: No acute distress.  Ortho Exam: Right index finger surgical incisions well approximated with nylon suturessigns of infection.  Specialty Comments:  No specialty comments available.  Imaging: No results found.   PMFS History: Patient Active Problem List   Diagnosis Date Noted  . Necrosis of right index finger (HCC) 08/03/2016  . Cellulitis and abscess of hand 06/27/2016  . Abnormal involuntary movement 05/24/2016  . Autism spectrum disorder with accompanying language impairment, requiring very substantial support (level 3) 10/20/2015  . 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 Partial epilepsy with impairment of consciousness (HCC) 10/20/2015  . Epilepsy (HCC) 08/31/2015  . Behavior  disturbance 08/31/2015  . Autism 08/31/2015  . MR (mental retardation) 08/31/2015   Past Medical History:  Diagnosis Date  . Absence seizures, intractable (HCC)   . Aggressive behavior   . Autism disorder   . Chronic static encephalopathy   . Heart murmur   . PONV (postoperative nausea and vomiting)    FOLLOWING MRI UNDER GENERAL ANESTHERSIA ASPIRATED  . Seizures (HCC)     No family history on file.  Past Surgical History:  Procedure Laterality Date  . AMPUTATION Right 08/03/2016   Procedure: RIGHT INDEX FINGER AMPUTATION;  Surgeon: Kathryne Hitchhristopher Y Blackman, MD;  Location: [facility];  Service: Orthopedics;  Laterality: Right;  . DENTAL SURGERY     Social History   Occupational History  . Not on file.   Social History Main Topics  . Smoking status: Never Smoker  . Smokeless tobacco: Never Used  . Alcohol use No  . Drug use: No  . Sexual activity: No

## 2016-08-16 ENCOUNTER — Ambulatory Visit (INDEPENDENT_AMBULATORY_CARE_PROVIDER_SITE_OTHER): Payer: Medicaid Other | Admitting: Orthopedic Surgery

## 2016-08-16 ENCOUNTER — Inpatient Hospital Stay (INDEPENDENT_AMBULATORY_CARE_PROVIDER_SITE_OTHER): Payer: Medicaid Other | Admitting: Physician Assistant

## 2016-08-20 ENCOUNTER — Inpatient Hospital Stay (INDEPENDENT_AMBULATORY_CARE_PROVIDER_SITE_OTHER): Payer: Medicaid Other | Admitting: Physician Assistant

## 2016-08-29 ENCOUNTER — Ambulatory Visit (INDEPENDENT_AMBULATORY_CARE_PROVIDER_SITE_OTHER): Payer: Medicaid Other | Admitting: Physician Assistant

## 2016-08-30 ENCOUNTER — Ambulatory Visit (INDEPENDENT_AMBULATORY_CARE_PROVIDER_SITE_OTHER): Payer: Medicaid Other | Admitting: Physician Assistant

## 2016-08-30 DIAGNOSIS — S68119D Complete traumatic metacarpophalangeal amputation of unspecified finger, subsequent encounter: Secondary | ICD-10-CM

## 2016-08-30 NOTE — Progress Notes (Signed)
   Office Visit Note   Patient: Nathan RowerBenjamin Broadnax           Date of Birth: Mar 04, 1996           MRN: [id number] Visit Date: 08/30/2016              Requested by: Jackie PlumGeorge Osei-Bonsu, MD [id number] [place], [place] [id number] PCP: Jackie PlumSEI-BONSU,GEORGE, MD   Assessment & Plan: Visit Diagnoses: No diagnosis found.  Plan: Left thumb will will DC dressing no triple antibiotic. Tums B wash with antibacterial soap. Becomes dry need some type of moisturizer would recommend Vaseline. Right index finger amputation site stitches removed signs of infection. Continue dressing for the next couple of days and then DC. He can get the hand wet.  Follow-Up Instructions: No Follow-up on file.   Orders:  No orders of the defined types were placed in this encounter.  No orders of the defined types were placed in this encounter.     Procedures: No procedures performed   Clinical Data: No additional findings.   Subjective: Chief Complaint  Patient presents with  . Right Hand - Follow-up    Patient is here to follow-up his finger amputation that he recently had. He has the entire finger covered with coban. He has no complaints today.    Review of Systems   Objective: Vital Signs: There were no vitals taken for this visit.  Physical Exam  Ortho Exam Left thumb with the maceration no signs of infection. Right index finger surgical Incisions healing well well approximated with interrupted nylon sutures. Does have abrasions from the dressing in the first webspace but no signs of gross infection. Specialty Comments:  No specialty comments available.  Imaging: No results found.   PMFS History: Patient Active Problem List   Diagnosis Date Noted  . Necrosis of right index finger (HCC) 08/03/2016  . Cellulitis and abscess of hand 06/27/2016  . Abnormal involuntary movement 05/24/2016  . Autism spectrum disorder with accompanying language impairment, requiring very substantial  support (level 3) 10/20/2015  . 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 Partial epilepsy with impairment of consciousness (HCC) 10/20/2015  . Epilepsy (HCC) 08/31/2015  . Behavior disturbance 08/31/2015  . Autism 08/31/2015  . MR (mental retardation) 08/31/2015   Past Medical History:  Diagnosis Date  . Absence seizures, intractable (HCC)   . Aggressive behavior   . Autism disorder   . Chronic static encephalopathy   . Heart murmur   . PONV (postoperative nausea and vomiting)    FOLLOWING MRI UNDER GENERAL ANESTHERSIA ASPIRATED  . Seizures (HCC)     No family history on file.  Past Surgical History:  Procedure Laterality Date  . AMPUTATION Right 08/03/2016   Procedure: RIGHT INDEX FINGER AMPUTATION;  Surgeon: Kathryne Hitchhristopher Y Blackman, MD;  Location: [facility];  Service: Orthopedics;  Laterality: Right;  . DENTAL SURGERY     Social History   Occupational History  . Not on file.   Social History Main Topics  . Smoking status: Never Smoker  . Smokeless tobacco: Never Used  . Alcohol use No  . Drug use: No  . Sexual activity: No

## 2016-09-10 ENCOUNTER — Other Ambulatory Visit (INDEPENDENT_AMBULATORY_CARE_PROVIDER_SITE_OTHER): Payer: Self-pay | Admitting: Pediatrics

## 2016-09-13 ENCOUNTER — Telehealth (INDEPENDENT_AMBULATORY_CARE_PROVIDER_SITE_OTHER): Payer: Self-pay | Admitting: *Deleted

## 2016-09-13 ENCOUNTER — Ambulatory Visit (INDEPENDENT_AMBULATORY_CARE_PROVIDER_SITE_OTHER): Payer: Medicaid Other | Admitting: Physician Assistant

## 2016-09-13 ENCOUNTER — Encounter (INDEPENDENT_AMBULATORY_CARE_PROVIDER_SITE_OTHER): Payer: Self-pay | Admitting: Physician Assistant

## 2016-09-13 ENCOUNTER — Telehealth (INDEPENDENT_AMBULATORY_CARE_PROVIDER_SITE_OTHER): Payer: Self-pay | Admitting: Orthopaedic Surgery

## 2016-09-13 DIAGNOSIS — S68110A Complete traumatic metacarpophalangeal amputation of right index finger, initial encounter: Secondary | ICD-10-CM

## 2016-09-13 NOTE — Progress Notes (Signed)
Office Visit Note   Patient: Nathan Moore           Date of Birth: 08/21/1996           MRN: [id number] Visit Date: 09/13/2016              Requested by: Jackie PlumGeorge Osei-Bonsu, MD [address] [place], [place] [id number] PCP: Jackie PlumSEI-BONSU,GEORGE, MD   Assessment & Plan: Visit Diagnoses:  1. Amputation of right index finger     Plan: Spoke with the caregiver that was with him today that we'll see him back on an as-needed basis. Recommended wrapping the the index finger with Coban and is needed to help protect it from him biting on.  Follow-Up Instructions: Return if symptoms worsen or fail to improve.   Orders:  No orders of the defined types were placed in this encounter.  No orders of the defined types were placed in this encounter.     Procedures: No procedures performed   Clinical Data: No additional findings.   Subjective: Chief Complaint  Patient presents with  . Right Hand - Routine Post Op    08/03/16 right index finger amputation through proximal phalanx. He is 41 days post op.    Patient presents today right follow up of right hand. He is status post right index finger amputation through proximal phalanx 08/03/2016. He is 41 days post op. He care giver today expresses that patients hand was doing well, but patient place finger in his mouth and bit down chewing his flesh.There is an open wound. There is no drainage. Caregiver has great difficulty dressing finger due to patients cognitive disability and noncompliance. Caregiver wanting to know if he can be have prescription for restraints.     Review of Systems   Objective: Vital Signs: There were no vitals taken for this visit.  Physical Exam  Ortho Exam Right index finger surgical incision is healing well he does have a scab over the most radial aspect of the wound. A signs of gross infection. Left thumb completely healed at this point in time. Specialty Comments:  No specialty comments  available.  Imaging: No results found.   PMFS History: Patient Active Problem List   Diagnosis Date Noted  . Necrosis of right index finger (HCC) 08/03/2016  . Cellulitis and abscess of hand 06/27/2016  . Abnormal involuntary movement 05/24/2016  . Autism spectrum disorder with accompanying language impairment, requiring very substantial support (level 3) 10/20/2015  . 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 Partial epilepsy with impairment of consciousness (HCC) 10/20/2015  . Epilepsy (HCC) 08/31/2015  . Behavior disturbance 08/31/2015  . Autism 08/31/2015  . MR (mental retardation) 08/31/2015   Past Medical History:  Diagnosis Date  . Absence seizures, intractable (HCC)   . Aggressive behavior   . Autism disorder   . Chronic static encephalopathy   . Heart murmur   . PONV (postoperative nausea and vomiting)    FOLLOWING MRI UNDER GENERAL ANESTHERSIA ASPIRATED  . Seizures (HCC)     History reviewed. No pertinent family history.  Past Surgical History:  Procedure Laterality Date  . AMPUTATION Right 08/03/2016   Procedure: RIGHT INDEX FINGER AMPUTATION;  Surgeon: Kathryne Hitchhristopher Y Blackman, MD;  Location: [facility];  Service: Orthopedics;  Laterality: Right;  . DENTAL SURGERY     Social History   Occupational History  . Not on file.   Social History Main Topics  . Smoking status: Never Smoker  . Smokeless tobacco: Never Used  .  Alcohol use No  . Drug use: No  . Sexual activity: No

## 2016-09-13 NOTE — Telephone Encounter (Signed)
See below

## 2016-09-13 NOTE — Telephone Encounter (Signed)
[facility] called, amy discharged pt. Caregivers are independent with wound care. If pt has anymore needs he would need a new referral. Any questions call amy back [phone]

## 2016-09-13 NOTE — Telephone Encounter (Signed)
Nathan PostAlex, Nathan Moore's caretaker called about Nathan Moore trying to chew off the surgical site of his amputated finger. According to Nathan Moore, the finger was amputated because Nathan Moore had an issue chewing his finger to the point of bleeding before. He still continues to chew the site to the point of bleeding.  Nathan Moore is wondering if Dr. Magnus IvanBlackman can write a Rx for cloth restraints for Nathan Moore so he's unable to reach his hand and chew it. He mentioned Nathan Moore was in cloth restraints at the hospital where his hands were behind his back.  Nathan Moore [facility] an appointment [facility] a [facility] location on December 20th but Nathan Moore needs something now that will help Nathan Moore leave the surgical site alone. Please give him a call regarding this.  NOTE: Nathan Moore is Autistic and unable to speak.  Nathan Moore's ph# [phone] Thank you.

## 2016-09-14 NOTE — Telephone Encounter (Signed)
Nathan Moore saw this patient in office today and told his caregiver he needed to get this from his primary care provider

## 2016-09-25 ENCOUNTER — Ambulatory Visit (INDEPENDENT_AMBULATORY_CARE_PROVIDER_SITE_OTHER): Payer: Medicaid Other | Admitting: Pediatrics

## 2016-09-25 ENCOUNTER — Encounter (INDEPENDENT_AMBULATORY_CARE_PROVIDER_SITE_OTHER): Payer: Self-pay | Admitting: Pediatrics

## 2016-09-25 VITALS — BP 120/72 | HR 76 | Ht 73.5 in | Wt 183.2 lb

## 2016-09-25 DIAGNOSIS — F71 Moderate intellectual disabilities: Secondary | ICD-10-CM | POA: Diagnosis not present

## 2016-09-25 DIAGNOSIS — R259 Unspecified abnormal involuntary movements: Secondary | ICD-10-CM | POA: Diagnosis not present

## 2016-09-25 DIAGNOSIS — F840 Autistic disorder: Secondary | ICD-10-CM

## 2016-09-25 DIAGNOSIS — G40209 Localization-related (focal) (partial) symptomatic epilepsy and epileptic syndromes with complex partial seizures, not intractable, without status epilepticus: Secondary | ICD-10-CM | POA: Diagnosis not present

## 2016-09-25 MED ORDER — LEVETIRACETAM 500 MG PO TABS: 1000.0000 mg | ORAL_TABLET | Freq: Two times a day (BID) | ORAL | 5 refills | Status: DC

## 2016-09-25 NOTE — Progress Notes (Signed)
Patient: Nathan Moore MRN: [id number] Sex: male DOB: [birth year]  Provider: Deetta Perla, MD Location of Care: [facility]  Note type: Routine return visit  History of Present Illness: Referral Source: Melvyn Novas, MD History from: caregiver, patient and [facility] chart Chief Complaint: Seizures  Nathan Moore is a 20 y.o. male who returns September 25, 2016, for the first time since July 24, 2016.  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  This has healed fairly well, but he has begun to rub it and has caused a new lesion of the skin.  He was here today with his caregiver, Nathan Moore who oversees his care at the group home where he lives.  He has experienced a number of episodes where he falls to the floor, digs his hands into his mouth until it bleeds.  Interestingly, [name] called him sharply "stop it [facility]".  He will often stop when he is doing, at least temporarily.  He has flexion and extension of his legs.  The episodes sometime last as long as 45 minutes.  This is clearly behavioral and non-epileptic.  This usually happens when he becomes frustrated or if he is told that he cannot do something he wants to do, but he will have to wait when he does not want to do so.  His last clear seizure occurred about five weeks ago.  He often will turn around in tight circles with his hands in his mouth, but he is truly unresponsive.  I asked [name] to videotape the behavior, because I am not even certain that this represents seizure activity.  What is clear however is his seizures are relatively infrequent in comparison with earlier in the year and for that  reason there is no indication to increase his medication.  He continues to attend [employer].  School officials and witnessed the same behaviors as described above and considered them seizures.  [name] came to the school when he was having a "seizure."  He told Nathan Apple to get up and he immediately did so, which was very helpful for the teachers and staff.  His general health has been good except for the mutilation of his finger.  There have been no phone calls made to my office concerning seizures since I saw him in October 2017.  His amputation occurred on August 03, 2016.  He is followed by the orthopedic surgery service for this.  Review of Systems: 12 system review was remarkable for behavior issues; the remainder was assessed and was negative  Past Medical History Diagnosis Date  . Absence seizures, intractable (HCC)   . Aggressive behavior   . Autism disorder   . Chronic static encephalopathy   . Heart murmur   . PONV (postoperative nausea and vomiting)    FOLLOWING MRI UNDER GENERAL ANESTHERSIA ASPIRATED  . Seizures (HCC)    Hospitalizations: No., Head Injury: No., Nervous System Infections: No., Immunizations up to date: Yes.    See office note from [facility] August 31, 2015 for recent history related to seizures; recent medications used to attempt to control seizures included carbamazepine, clonazepam, and Onfi; Some episodes appeared clearly to be complex partial in nature and prolonged; others are less clearcut  behaviors where he seems to be unresponsive and then suddenly returns to his baseline.  He has had only one EEG which was carried out with sedated sleep with lorazepam which predictably showed slowing of the background and no interictal activity.  This was [facility].  Birth History 8 lbs. 10 oz. infant born at 638 weeks gestational age to a 19 year old male. Gestation was complicated by gestational diabetes Mother received  Epidural anesthesia  primary cesarean section Nursery Course was complicated by hypoglycemia, requirement for oxygen supplementation, elevated bilirubin Growth and Development was recalled as global delays, problems with behavior started at age 20, seizures at age 12-1/2 Growth and Development was recalled as  delayed for language development  Behavior History autism spectrum disorder, level III  Surgical History Procedure Laterality Date  . AMPUTATION Right 08/03/2016   Procedure: RIGHT INDEX FINGER AMPUTATION;  Surgeon: Kathryne Hitchhristopher Y Blackman, MD;  Location: [facility];  Service: Orthopedics;  Laterality: Right;  . DENTAL SURGERY     Family History family history is not on file. Family history is negative for migraines, seizures, intellectual disabilities, blindness, deafness, birth defects, chromosomal disorder, or autism.  Social History . Marital status: Single    Spouse name: N/A  . Number of children: N/A  . Years of education: N/A   Social History Main Topics  . Smoking status: Never Smoker  . Smokeless tobacco: Never Used  . Alcohol use No  . Drug use: No  . Sexual activity: No   Social History Narrative    Nathan Moore is non-verbal. Nathan Moore is a 11th grade student at [employer].     [facility] lives at [facility] and has been there for 6-7 months.     His parents live in [place] and they visit once a month.     His caregiver reports him to be compliant at 8/10 of his medical visits and he states that his non-compliance does not include aggression, he will just sit in the same spot and not move.    No Known Allergies  Physical Exam BP 120/72   Pulse 76   Ht 6' 1.5" (1.867 m)   Wt 183 lb 3.2 oz (83.1 kg)   BMI 23.84 kg/m   General: alert, well developed, well nourished, in no acute distress, brown hair, blue eyes, right handed Head: normocephalic, no dysmorphic features Ears, Nose and Throat: Otoscopic: tympanic membranes normal; pharynx: oropharynx is  pink without exudates or tonsillar hypertrophy Neck: supple, full range of motion, no cranial or cervical bruits Respiratory: auscultation clear Cardiovascular: no murmurs, pulses are normal Musculoskeletal: amputation right index finger distal phalanx; no apparent scoliosis Skin: no neurocutaneous lesions; facial acne  Neurologic Exam  Mental Status: alert; smiling, tolerated handling and was somewhat cooperative for examination, limited ability to follow commands, does not speak, intermittent eye contact Cranial Nerves: visual fields are full to double simultaneous stimuli; extraocular movements are full and conjugate; pupils are round, reactive to light; funduscopic examination shows positive red reflex bilaterally; symmetric facial strength; midline tongue and uvula; he turns to localize sound bilaterally Motor: Normal functional strength, tone and mass; good fine motor movements Sensory: intact responses to cold, vibration, proprioception and stereognosis Coordination: good finger-to-nose, rapid repetitive alternating movements and finger apposition Gait and Station: slightly broad-based but stable gait and station Reflexes: symmetric and diminished bilaterally; no clonus; bilateral flexor plantar responses  Assessment 1. Partial epilepsy with impairment of consciousness, G40.209. 2. Autism spectrum disorder with  accompanying language impairment, F84.0. 3. 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 4. Abnormal involuntary movements, R25.9.  Discussion It is clear that many of his behaviors are non-epileptic, which is a very important insight.  The patient was smiling, relaxed, and not showing as much agitation as I have seen recently.  The combination of Fycompa and levetiracetam has been helpful.  Given that Fycompa is a relatively low dose, we certainly can push it higher.  It is clear that Onfi did little to help his seizures.  I do not think that there is much that we can do to get Nathan Moore  to stop the activities that are non-epileptic.  It is important however to be able to demonstrate clearly that he is not having seizures, so that he does not get over-treated with antiepileptic medicine either abortive or preventative.  Plan He will return to see me in four months' time.  I spent 30 minutes of face-to-face time with the patient and his caregiver, Trinna Postlex.  I refilled his prescription for levetiracetam.    Medication List   Accurate as of 09/25/16  8:38 AM.      ARIPiprazole 5 MG tablet Commonly known as:  ABILIFY Take 5 mg by mouth daily.   cetirizine 10 MG tablet Commonly known as:  ZYRTEC Take 10 mg by mouth daily.   chlorproMAZINE 100 MG tablet Commonly known as:  THORAZINE Take 100-200 mg by mouth See admin instructions. Take 200 mg in the morning and then 100 mg at night   chlorproMAZINE 50 MG tablet Commonly known as:  THORAZINE TAKE 1 TABLET BY MOUTH EVERY 4 HOURS AS NEEDED FOR AGITATION   chlorproMAZINE 200 MG tablet Commonly known as:  THORAZINE Take 200 mg by mouth 2 (two) times daily.   doxycycline 100 MG tablet Commonly known as:  VIBRA-TABS Take 100 mg by mouth 2 (two) times daily.   FYCOMPA 2 MG Tabs Generic drug:  Perampanel Take 1 tablet twice daily   HYDROcodone-acetaminophen 5-325 MG tablet Commonly known as:  NORCO Take 1-2 tablets by mouth every 6 (six) hours as needed for moderate pain.   levETIRAcetam 500 MG tablet Commonly known as:  KEPPRA TAKE 2 TABLETS BY MOUTH 2 TIMES DAILY   ONFI 10 MG tablet Generic drug:  cloBAZam TAKE ONE AND A HALF TABLETS BY MOUTH 2 TIMES DAILY   polyethylene glycol packet Commonly known as:  MIRALAX / GLYCOLAX Take 17 g by mouth daily as needed for moderate constipation.   propranolol 80 MG tablet Commonly known as:  INDERAL Take 80 mg by mouth 3 (three) times daily.     The medication list was reviewed and reconciled. All changes or newly prescribed medications were explained.  A complete  medication list was provided to the patient/caregiver.  Deetta PerlaWilliam H Cassadie Pankonin MD

## 2016-10-01 ENCOUNTER — Encounter: Payer: Self-pay | Admitting: Physician Assistant

## 2016-10-03 ENCOUNTER — Telehealth (INDEPENDENT_AMBULATORY_CARE_PROVIDER_SITE_OTHER): Payer: Self-pay

## 2016-10-03 NOTE — Telephone Encounter (Signed)
Con MemosAlex Moore called stating that he needs a recommendation or referral for a GI doctor. He states that Nathan Moore is having problems with his stomach. He is requesting a call back.   [phone]

## 2016-10-04 NOTE — Telephone Encounter (Signed)
The phone number given was incorrect.  I reached the correct cell phone number and left a message for him to call back.

## 2016-10-05 NOTE — Telephone Encounter (Signed)
I reached Nathan Moore and told him that Dr. Suzzette RighterGeorge Ose Bonsu would have to make a consultation to a gastroenterologist.

## 2016-10-16 ENCOUNTER — Ambulatory Visit: Payer: Medicaid Other | Admitting: Physician Assistant

## 2016-11-05 ENCOUNTER — Other Ambulatory Visit (INDEPENDENT_AMBULATORY_CARE_PROVIDER_SITE_OTHER): Payer: Self-pay | Admitting: Family

## 2016-11-05 ENCOUNTER — Other Ambulatory Visit (INDEPENDENT_AMBULATORY_CARE_PROVIDER_SITE_OTHER): Payer: Self-pay | Admitting: Pediatrics

## 2016-11-05 DIAGNOSIS — G40209 Localization-related (focal) (partial) symptomatic epilepsy and epileptic syndromes with complex partial seizures, not intractable, without status epilepticus: Secondary | ICD-10-CM

## 2016-12-20 ENCOUNTER — Telehealth (INDEPENDENT_AMBULATORY_CARE_PROVIDER_SITE_OTHER): Payer: Self-pay | Admitting: Family

## 2016-12-20 NOTE — Telephone Encounter (Signed)
Con MemosAlex Young, group home caregiver, called and said that he was being audited and needed documentation that the Onfi was discontinued and the Fycompa started. I wrote a letter and faxed it to attention Haskell Rilingraci Jones at fax# [phone] at his request. TG

## 2016-12-20 NOTE — Telephone Encounter (Signed)
Thank you :)

## 2017-01-03 ENCOUNTER — Other Ambulatory Visit (INDEPENDENT_AMBULATORY_CARE_PROVIDER_SITE_OTHER): Payer: Self-pay | Admitting: Pediatrics

## 2017-01-03 DIAGNOSIS — G40209 Localization-related (focal) (partial) symptomatic epilepsy and epileptic syndromes with complex partial seizures, not intractable, without status epilepticus: Secondary | ICD-10-CM

## 2017-01-03 MED ORDER — FYCOMPA 2 MG PO TABS: ORAL_TABLET | ORAL | 1 refills | Status: AC

## 2017-01-03 MED ORDER — FYCOMPA 2 MG PO TABS: ORAL_TABLET | ORAL | 5 refills | Status: DC

## 2017-01-03 NOTE — Telephone Encounter (Signed)
°  Who's calling (name and relationship to patient) : Con MemosAlex Young  Best contact number: [phone]  Provider they see: Sharene SkeansHickling  Reason for call: Need refill of medication    PRESCRIPTION REFILL ONLY  Name of prescription: Fycompa 2 mg tabs  Pharmacy:[facility] [id number]

## 2017-01-03 NOTE — Telephone Encounter (Signed)
Please let patient know that the refill has been sent in and that he needs to schedule a follow up appointment for April. Thanks, Inetta Fermoina

## 2017-01-03 NOTE — Telephone Encounter (Signed)
Patient has been scheduled for April 17 @ 9:15

## 2017-01-29 ENCOUNTER — Ambulatory Visit (INDEPENDENT_AMBULATORY_CARE_PROVIDER_SITE_OTHER): Payer: Medicaid Other | Admitting: Pediatrics

## 2017-01-30 ENCOUNTER — Ambulatory Visit (INDEPENDENT_AMBULATORY_CARE_PROVIDER_SITE_OTHER): Payer: Medicaid Other | Admitting: Pediatrics

## 2017-02-26 ENCOUNTER — Telehealth: Payer: Self-pay

## 2017-02-27 NOTE — Telephone Encounter (Signed)
Error

## 2017-03-15 DEATH — deceased

## 2017-03-27 ENCOUNTER — Encounter (INDEPENDENT_AMBULATORY_CARE_PROVIDER_SITE_OTHER): Payer: Self-pay | Admitting: Pediatrics

## 2017-03-27 ENCOUNTER — Telehealth (INDEPENDENT_AMBULATORY_CARE_PROVIDER_SITE_OTHER): Payer: Self-pay | Admitting: Pediatrics

## 2017-03-27 NOTE — Telephone Encounter (Signed)
Letter was written and signed and will be mailed.

## 2017-03-27 NOTE — Telephone Encounter (Signed)
Letter stating what [employer] Health [employer] Human services needs was placed in Dr. Sharene SkeansHickling basket.

## 2017-03-27 NOTE — Telephone Encounter (Signed)
Error

## 2017-03-27 NOTE — Telephone Encounter (Signed)
°  Who's calling (name and relationship to patient) : Clarice Rising, MSW, LCSW Best contact number: [phone] (cell) Provider they see: Sharene SkeansHickling  Reason for call: Social worker is inquiring on about patient medical history.  Please call, she wants to speak with Dr. Sharene SkeansHickling.    PRESCRIPTION REFILL ONLY  Name of prescription:  Pharmacy:

## 2017-12-22 IMAGING — DX DG CHEST 1V PORT
2 series · 2 of 2 positions shown · non-contrast
Comparison: None.

CLINICAL DATA: Sepsis.  Autism.

EXAM:
PORTABLE CHEST 1 VIEW

[chest ap (1 of 2)]
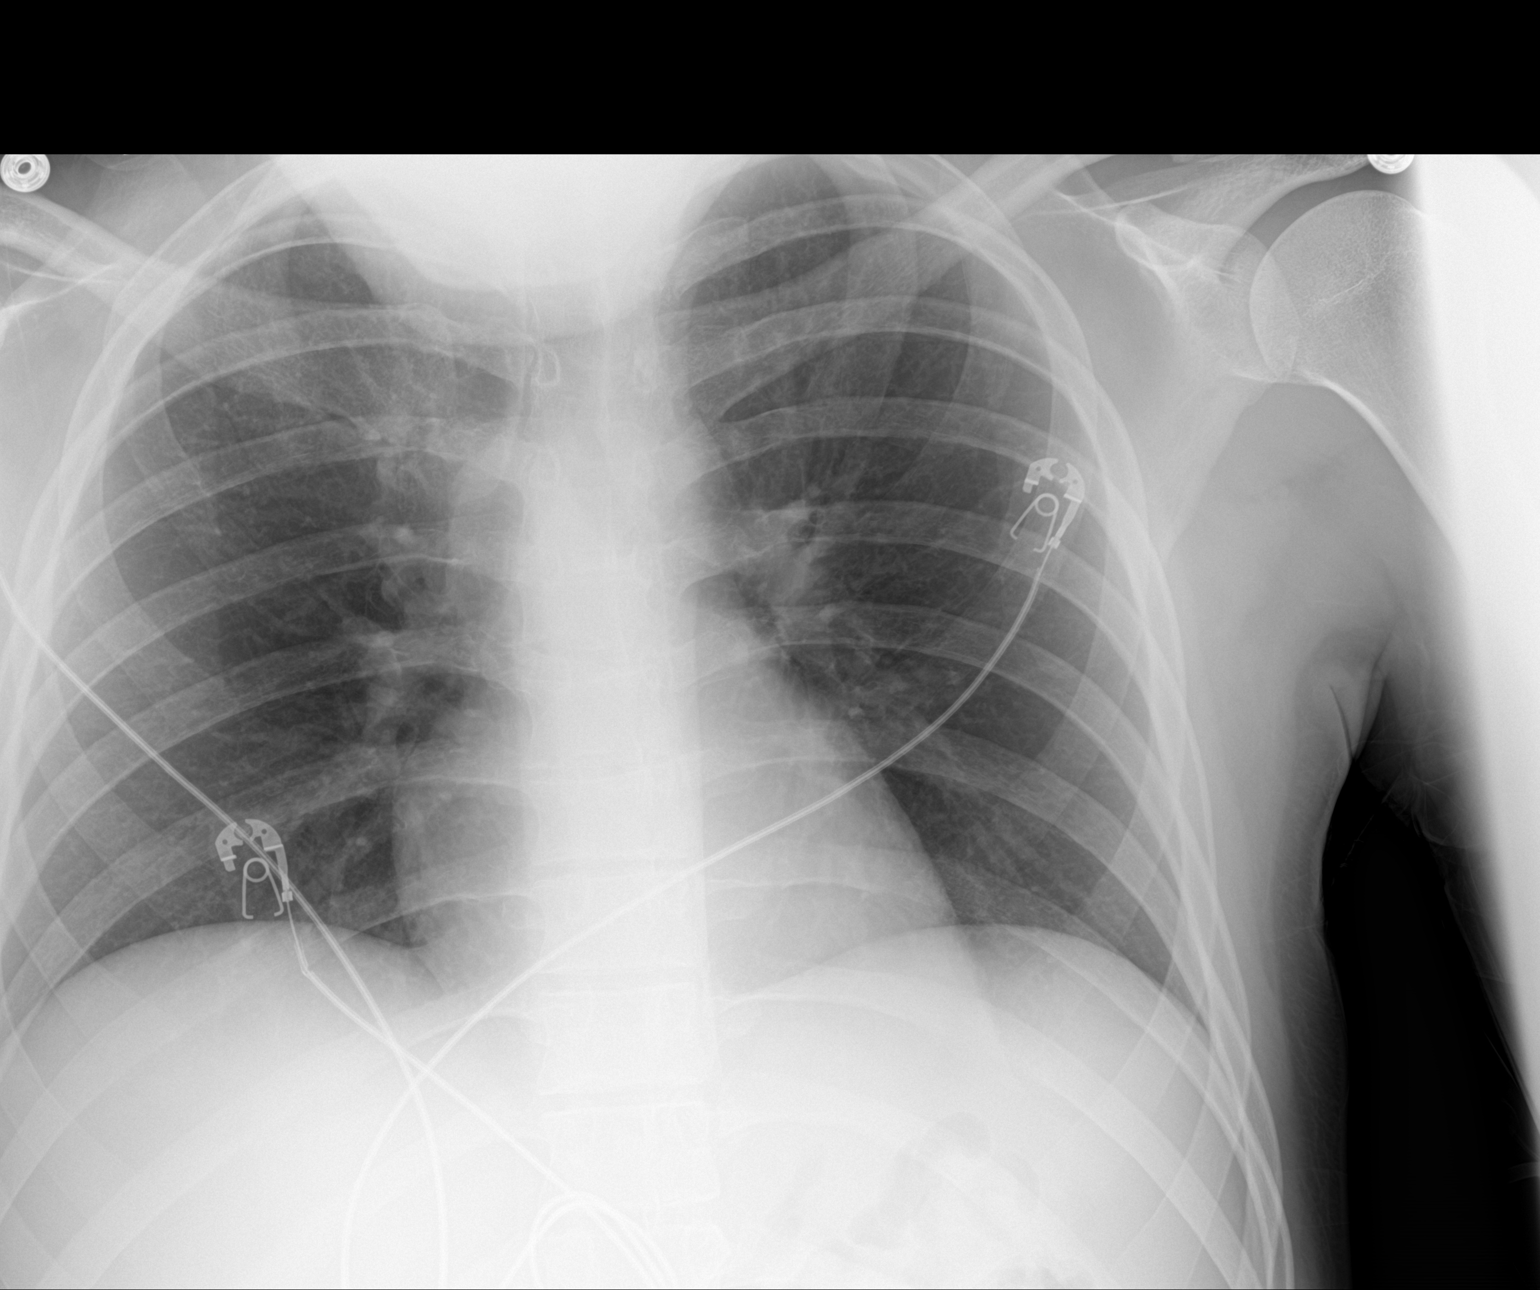

[chest ap (2 of 2)]
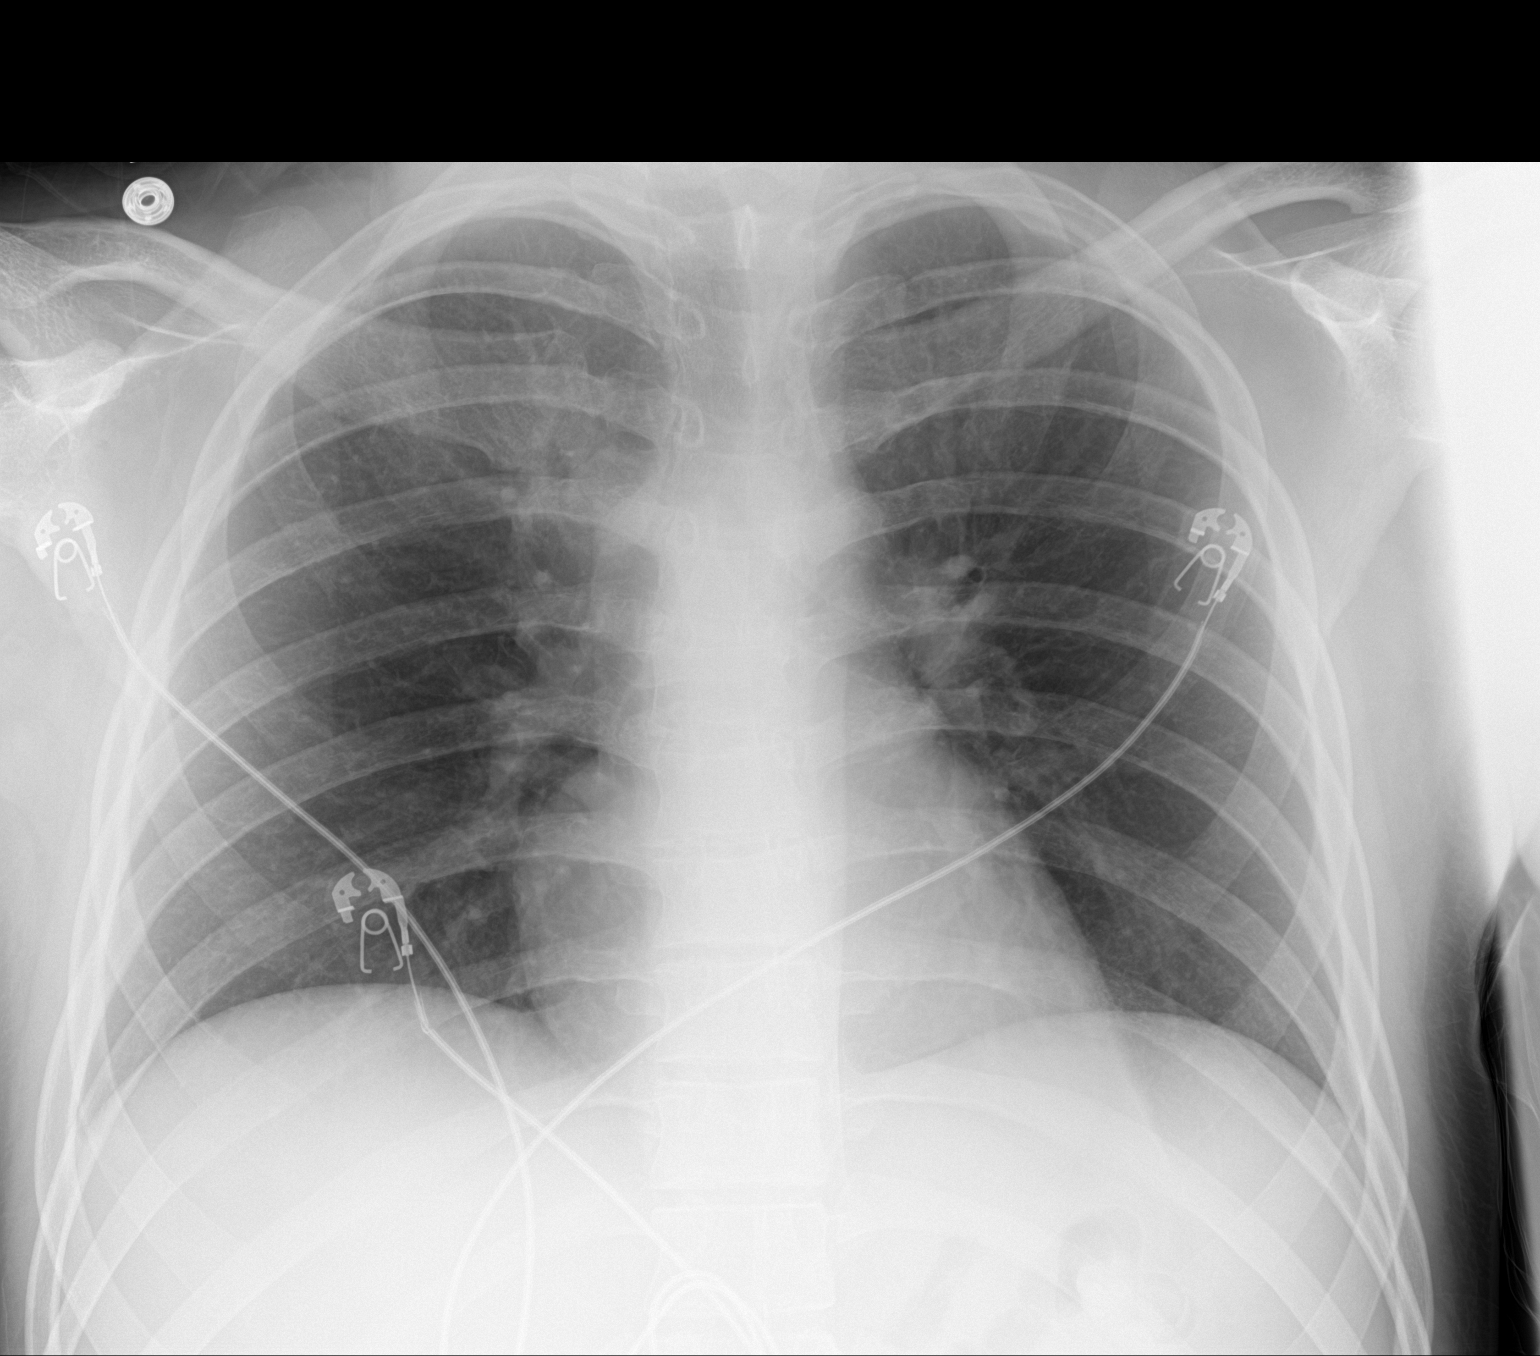

[2 of 2 positions shown; findings below may reference images not displayed]

FINDINGS: The heart size and mediastinal contours are within normal limits.
Both lungs are clear. The visualized skeletal structures are
unremarkable.
IMPRESSION: No active disease.

## 2017-12-22 IMAGING — DX DG HAND COMPLETE 3+V*R*
3 series · 3 of 3 positions shown · non-contrast
Comparison: None in PACs

CLINICAL DATA: Swelling, erythema, and whose in wounds of the right
index and middle fingers. The patient has been scratching this
region.

EXAM:
RIGHT HAND - COMPLETE 3+ VIEW

[hand ap]
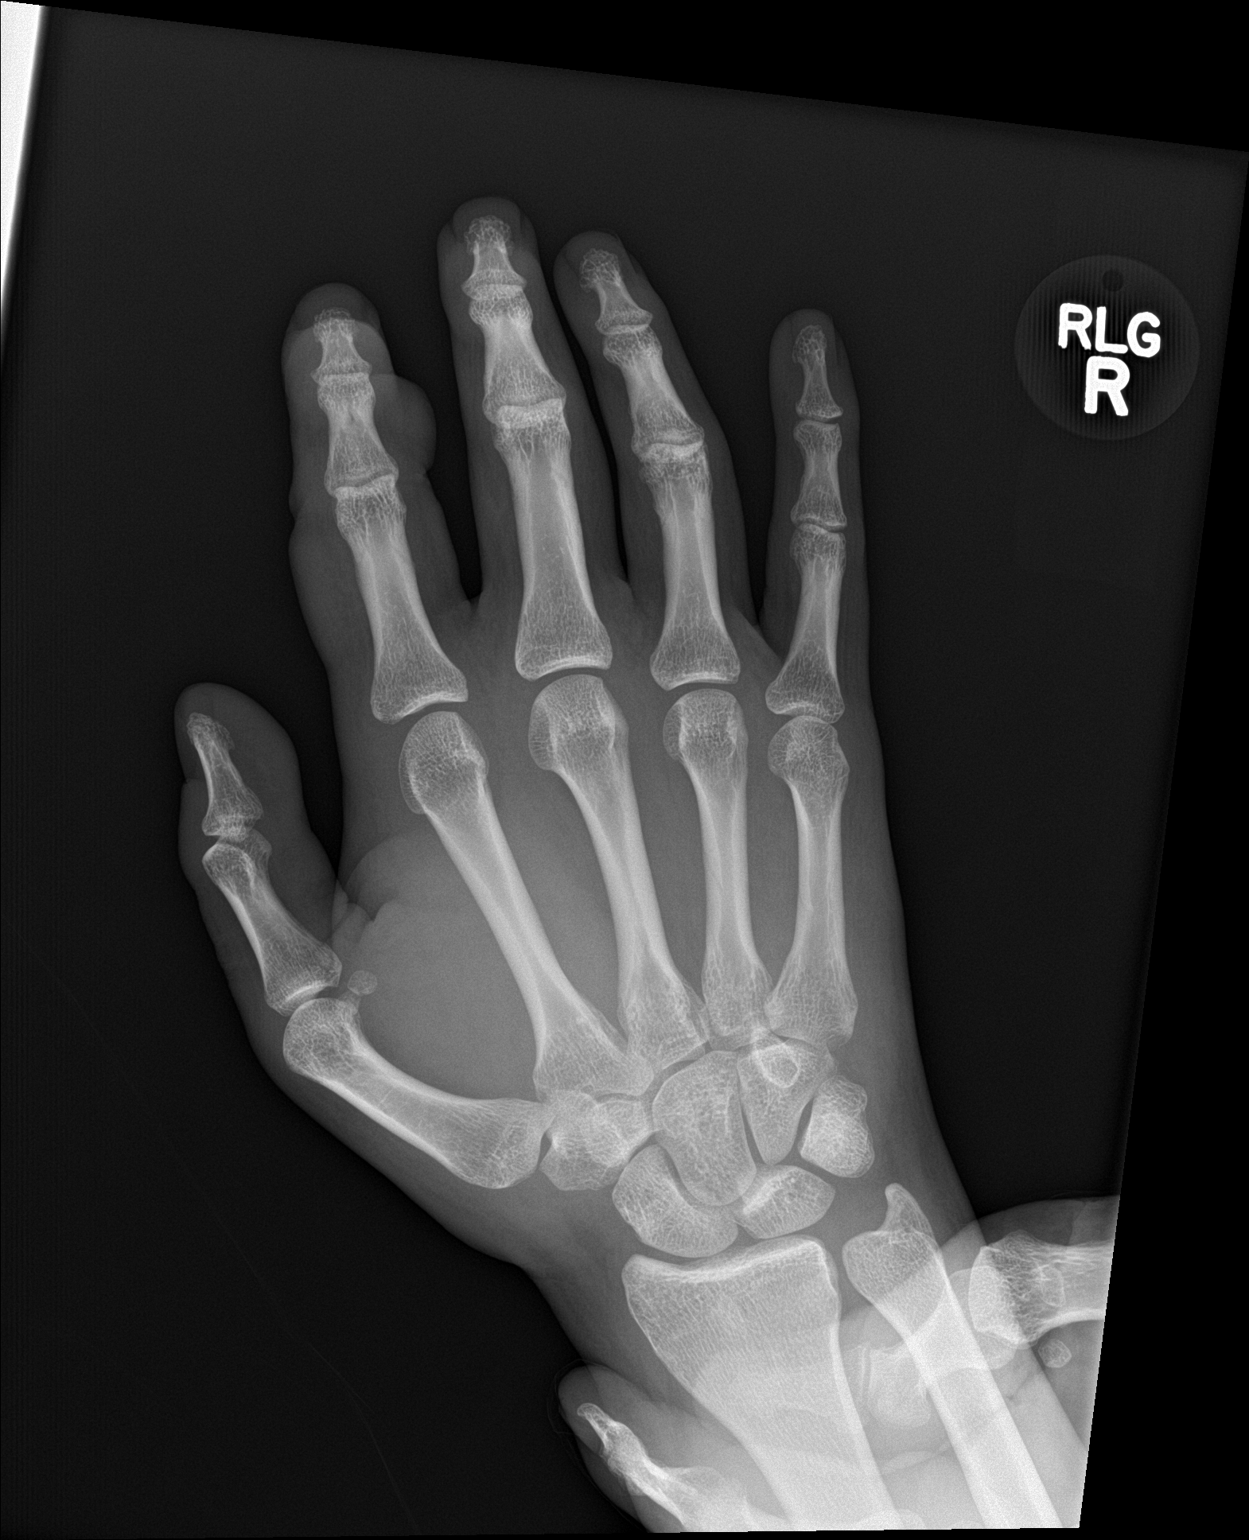

[hand obl]
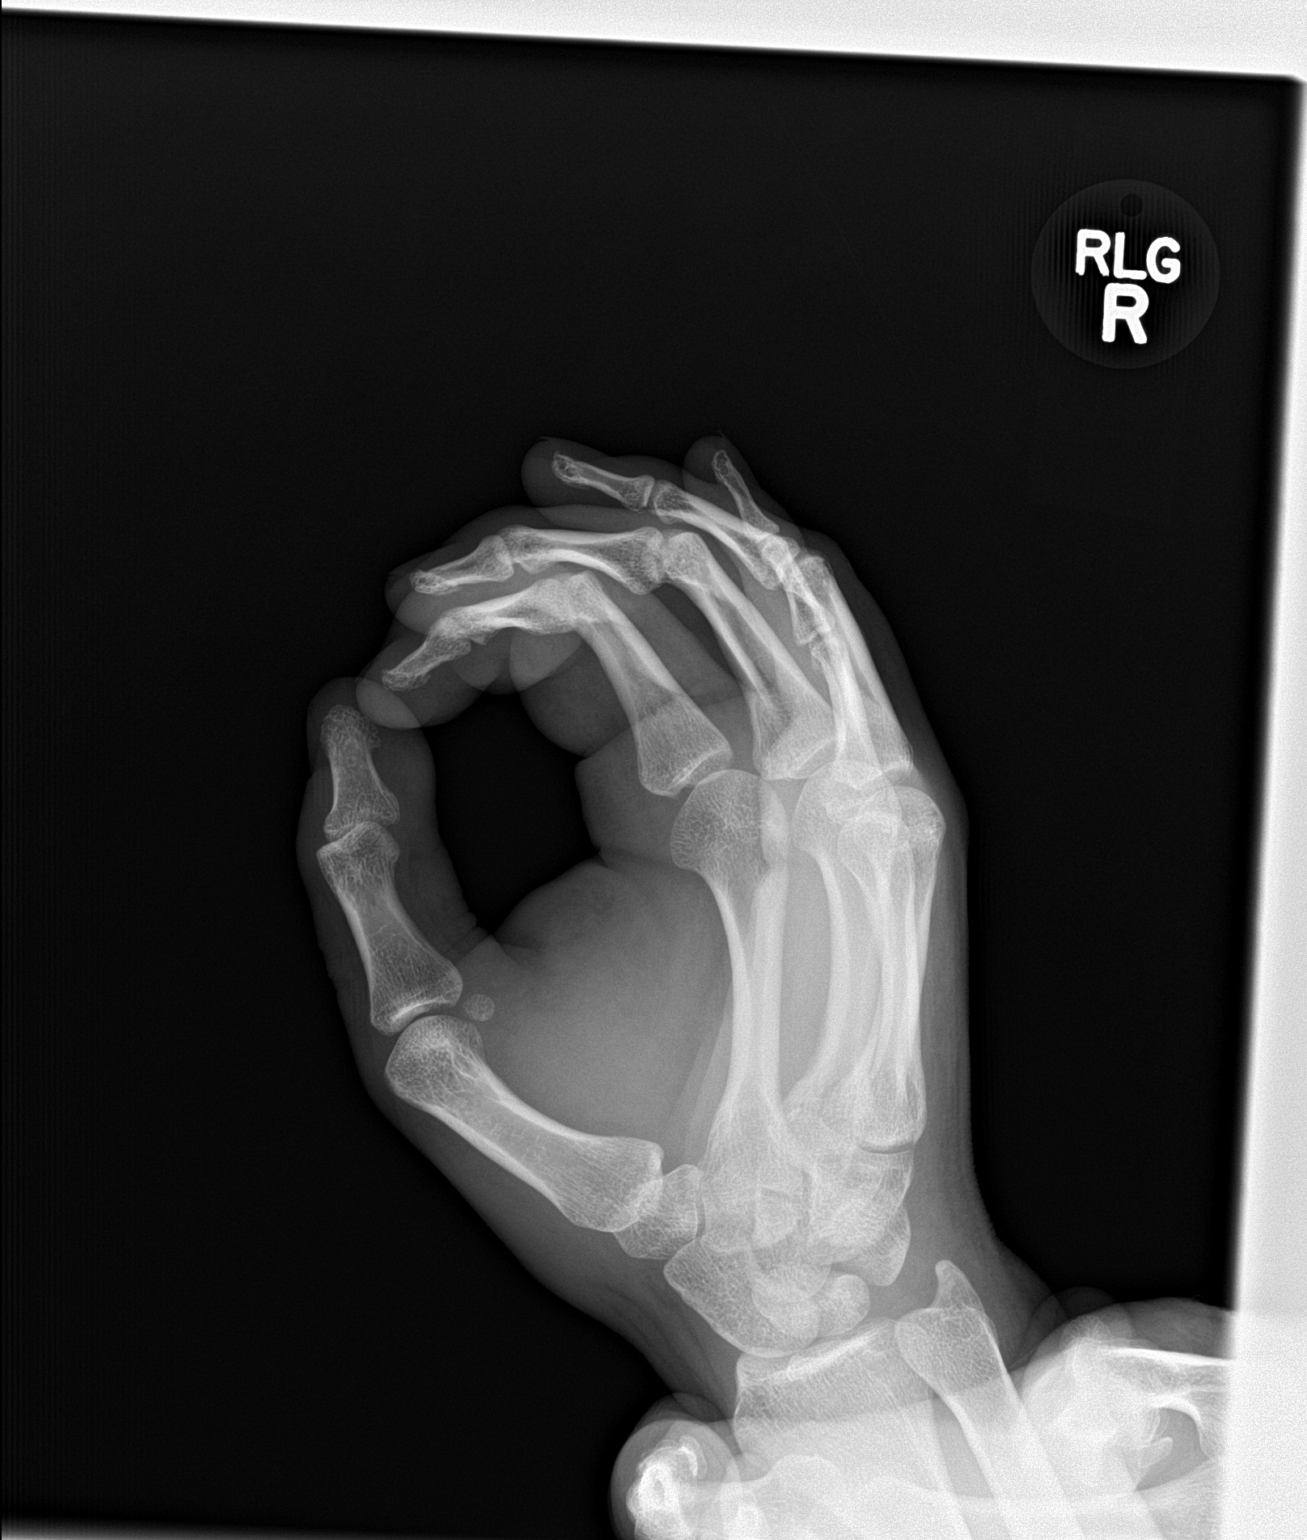

[hand lat]
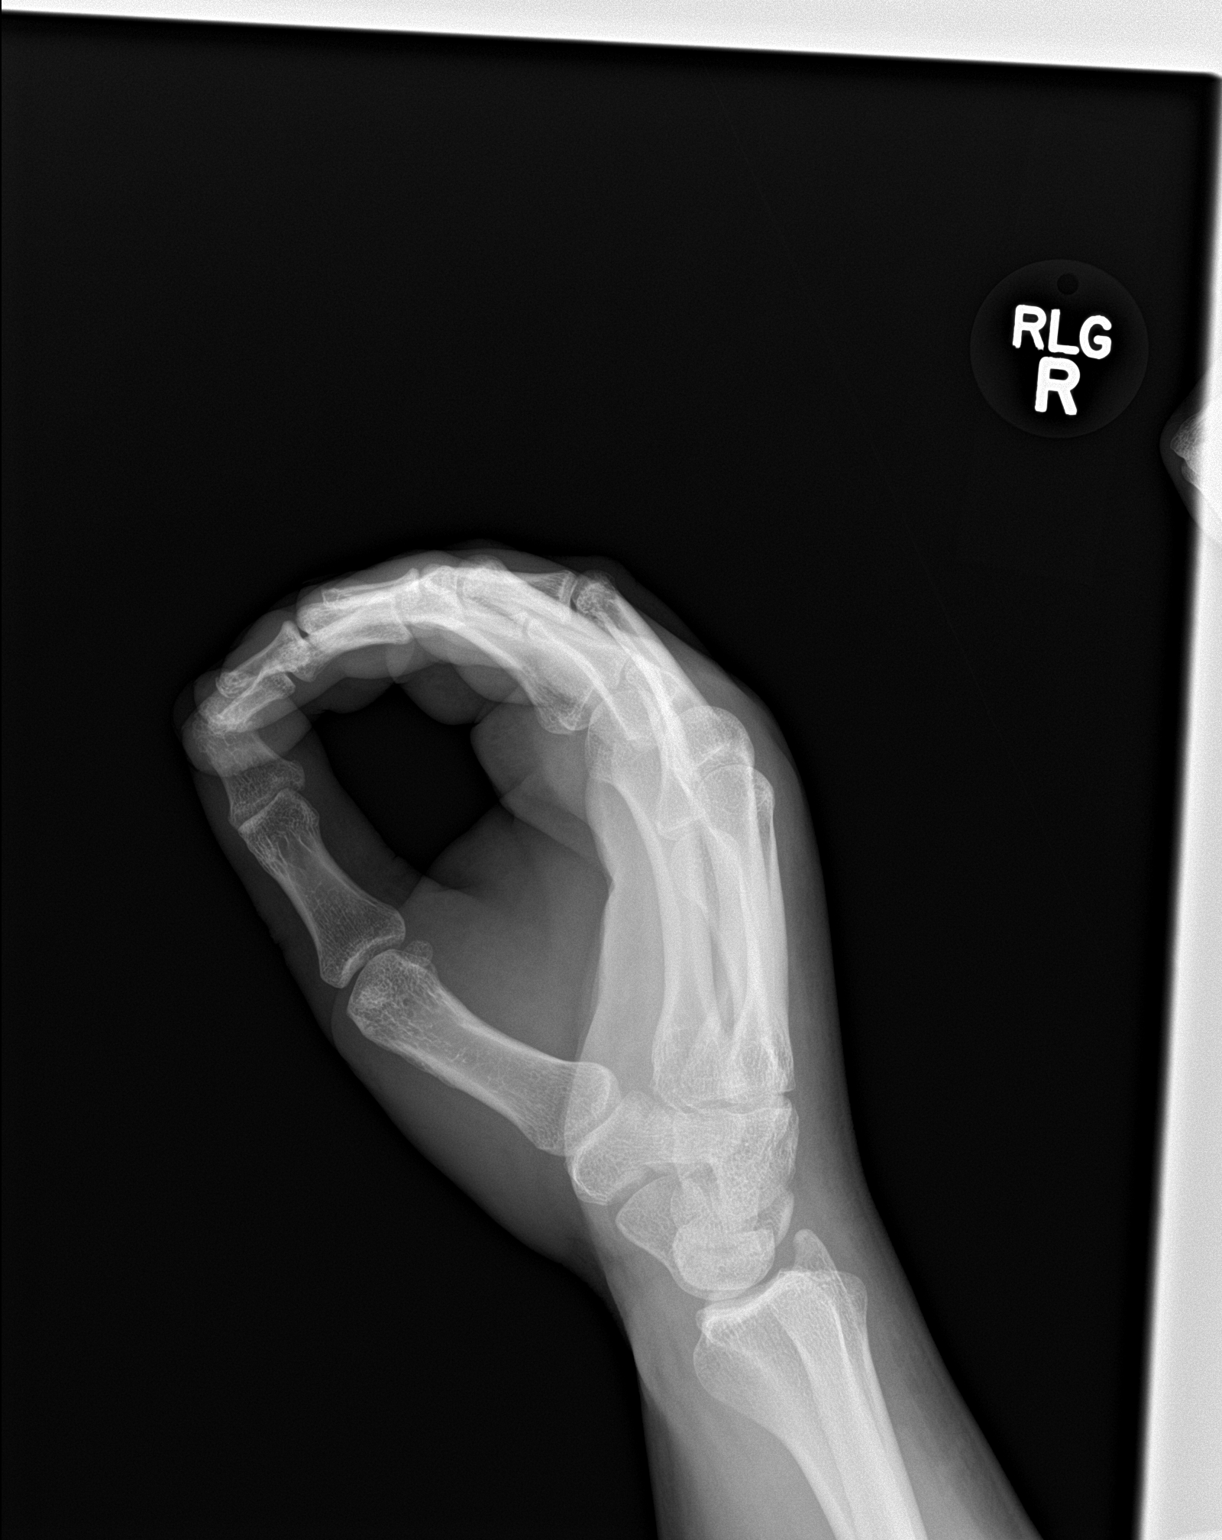

[3 of 3 positions shown; findings below may reference images not displayed]

FINDINGS: There is marked soft tissue swelling of the index finger with milder
swelling of the middle finger. No foreign bodies or gas collections
are observed. No underlying bony changes are demonstrated. Elsewhere
the bones of the right hand are normal.
IMPRESSION: Findings compatible with cellulitis of the index and third fingers.
No evidence of osteomyelitis.
